# Patient Record
Sex: Male | Born: 1990 | Race: White | Hispanic: No | Marital: Single | Smoking: Never smoker
Health system: Southern US, Community
[De-identification: ages and names within clinical notes are randomized; demographics above are authoritative.]

## PROBLEM LIST (undated history)

## (undated) DIAGNOSIS — T7840XA Allergy, unspecified, initial encounter: Secondary | ICD-10-CM

## (undated) DIAGNOSIS — D6852 Prothrombin gene mutation: Secondary | ICD-10-CM

## (undated) DIAGNOSIS — I82409 Acute embolism and thrombosis of unspecified deep veins of unspecified lower extremity: Principal | ICD-10-CM

## (undated) DIAGNOSIS — L709 Acne, unspecified: Secondary | ICD-10-CM

## (undated) DIAGNOSIS — F419 Anxiety disorder, unspecified: Secondary | ICD-10-CM

## (undated) HISTORY — DX: Allergy, unspecified, initial encounter: T78.40XA

## (undated) HISTORY — DX: Acne, unspecified: L70.9

## (undated) HISTORY — DX: Anxiety disorder, unspecified: F41.9

## (undated) HISTORY — DX: Acute embolism and thrombosis of unspecified deep veins of unspecified lower extremity: I82.409

## (undated) HISTORY — PX: NOSE SURGERY: SHX723

## (undated) HISTORY — PX: OTHER SURGICAL HISTORY: SHX169

## (undated) HISTORY — PX: KNEE SURGERY: SHX244

---
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
## 2009-01-16 ENCOUNTER — Ambulatory Visit: Payer: Self-pay | Admitting: Family Medicine

## 2009-11-26 ENCOUNTER — Telehealth: Payer: Self-pay | Admitting: Family Medicine

## 2010-01-19 ENCOUNTER — Ambulatory Visit: Payer: Self-pay | Admitting: Family Medicine

## 2010-04-16 ENCOUNTER — Ambulatory Visit: Payer: Self-pay | Admitting: Family Medicine

## 2010-06-18 ENCOUNTER — Ambulatory Visit: Payer: Self-pay | Admitting: Family Medicine

## 2010-09-03 ENCOUNTER — Ambulatory Visit: Payer: Self-pay | Admitting: Family Medicine

## 2010-10-12 NOTE — Assessment & Plan Note (Signed)
Summary: F/U ON ANXIETY PROBLEMS // RSN   Vital Signs:  Patient profile:   20 year old male Height:      60.5 inches Weight:      166 pounds BMI:     32.00 Temp:     98.0 degrees F oral BP sitting:   132 / 80  (right arm) CC: FU   CC:  FU.  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 comes in today for follow-up of anxiety.  He mostly has issues with performance anxiety.  To take Ativan and 20 to 40 mg of propranolol before big exams.  He also takes 20 mg of Celexa nightly.  This combination is working well and he has no, complaints.  He's functioned well and doing well in school.  Allergies: No Known Drug Allergies  Past History:  Past medical, surgical, family and social histories (including risk factors) reviewed for relevance to current acute and chronic problems.  Past Medical History: Reviewed history from 11/17/2008 and no changes required. acne Allergic rhinitis Asthma anxiety disorder  Family History: Reviewed history and no changes required.  Social History: Reviewed history from 11/01/2007 and no changes required. Single Never Smoked Alcohol use-no Drug use-no Regular exercise-yes  Review of Systems      See HPI  Physical Exam  General:  Well-developed,well-nourished,in no acute distress; alert,appropriate and cooperative throughout examination Psych:  Cognition and judgment appear intact. Alert and cooperative with normal attention span and concentration. No apparent delusions, illusions, hallucinations   Impression & Recommendations:  Problem # 1:  ANXIETY STATE, UNSPECIFIED (ICD-300.00) Assessment Improved  His updated medication list for this problem includes:    Celexa 20 Mg Tabs (Citalopram hydrobromide) .Marland Kitchen... 1 tab @ bedtime    Ativan 0.5 Mg Tabs (Lorazepam) .Marland Kitchen... Take 1 tablet by mouth two times a day as needed  Complete Medication List: 1)  Flonase 50 Mcg/act Susp (Fluticasone propionate) .... 2 sprays each  nostril daily 2)  Propranolol Hcl 20 Mg Tabs (Propranolol hcl) .... Take one or 2 60 minutes prior to public speaking 3)  Celexa 20 Mg Tabs (Citalopram hydrobromide) .Marland Kitchen.. 1 tab @ bedtime 4)  Ativan 0.5 Mg Tabs (Lorazepam) .... Take 1 tablet by mouth two times a day as needed  Patient Instructions: 1)  continue your current medical program return in one year for follow-up, sooner if any problems Prescriptions: CELEXA 20 MG TABS (CITALOPRAM HYDROBROMIDE) 1 tab @ bedtime  #100 x 3   Entered and Authorized by:   Roderick Pee MD   Signed by:   Roderick Pee MD on 01/19/2010   Method used:   Print then Give to Patient   RxID:   [id number] PROPRANOLOL HCL 20 MG TABS (PROPRANOLOL HCL) take one or 2 60 minutes prior to public speaking  #50 x 4   Entered and Authorized by:   Roderick Pee MD   Signed by:   Roderick Pee MD on 01/19/2010   Method used:   Print then Give to Patient   RxID:   [id number] FLONASE 50 MCG/ACT SUSP (FLUTICASONE PROPIONATE) 2 sprays each nostril daily  #1 x 3   Entered and Authorized by:   Roderick Pee MD   Signed by:   Roderick Pee MD on 01/19/2010   Method used:   Print then Give to Patient   RxID:   [id number]

## 2010-10-12 NOTE — Assessment & Plan Note (Signed)
Summary: hpv inj/njr  Nurse Visit   Allergies: No Known Drug Allergies  Immunizations Administered:  Tetanus Vaccine:    Vaccine Type: Tdap    Site: left deltoid    Mfr: GlaxoSmithKline    Dose: 0.5 ml    Route: IM    Given by: Kern Reap CMA (AAMA)    Exp. Date: 03/11/2012    Lot #: [id number]    VIS given: 07/31/07 version given April 16, 2010.    Physician counseled: yes  HPV # 1:    Vaccine Type: Gardasil    Site: right deltoid    Mfr: Merck    Dose: 0.5 ml    Route: IM    Given by: Kern Reap CMA (AAMA)    Exp. Date: 04/22/2012    Lot #: [id number]    VIS given: 10/14/05 version given April 16, 2010.    Physician counseled: yes  Orders Added: 1)  Tdap => 71yrs IM [90715] 2)  Admin 1st Vaccine [90471] 3)  HPV Vaccine - 3 sched doses - IM [90649] 4)  Admin of Any Addtl Vaccine [14782]

## 2010-10-12 NOTE — Progress Notes (Signed)
Summary: ativan refill  Phone Note Call from Patient   Summary of Call: patient is calling for a refill of ativan. his last office visit was 5/10. is this okay to fill?  pharmacy [phone] and patient's cell number is [phone] Initial call taken by: Kern Reap CMA Duncan Dull),  November 26, 2009 1:49 PM  Follow-up for Phone Call        okay to refill medicine through May.  He needs a visit in May Follow-up by: Roderick Pee MD,  November 26, 2009 1:58 PM    Prescriptions: ATIVAN 0.5 MG TABS (LORAZEPAM) Take 1 tablet by mouth two times a day as needed  #60 x 2   Entered by:   Kern Reap CMA (AAMA)   Authorized by:   Roderick Pee MD   Signed by:   Kern Reap CMA (AAMA) on 11/26/2009   Method used:   Telephoned to ...       [facility]* (retail)       [address]       [place], [place]  [address]       Ph: [phone]       Fax: [phone]   RxID:   [id number]

## 2010-10-12 NOTE — Assessment & Plan Note (Signed)
Summary: 2nd hpv inj/cjr  Nurse Visit   Review of Systems       Flu Vaccine Consent Questions     Do you have a history of severe allergic reactions to this vaccine? no    Any prior history of allergic reactions to egg and/or gelatin? no    Do you have a sensitivity to the preservative Thimersol? no    Do you have a past history of Guillan-Barre Syndrome? no    Do you currently have an acute febrile illness? no    Have you ever had a severe reaction to latex? no    Vaccine information given and explained to patient? yes    Are you currently pregnant? no    Lot Number:[id number]   Exp Date:03/12/2011   Site Given  Right  Deltoid IM    Allergies: No Known Drug Allergies  Immunizations Administered:  HPV # 2:    Vaccine Type: Gardasil    Site: left deltoid    Mfr: Merck    Dose: 0.5 ml    Route: IM    Given by: Kern Reap CMA (AAMA)    Exp. Date: 05/24/2012    Lot #: [id number]    Physician counseled: yes  Orders Added: 1)  Admin 1st Vaccine [90471] 2)  Flu Vaccine 47yrs + [16109] 3)  HPV Vaccine - 3 sched doses - IM [90649] 4)  Admin of Any Addtl Vaccine [60454]

## 2010-10-14 NOTE — Assessment & Plan Note (Signed)
Summary: med check//ccm   Vital Signs:  Patient profile:   20 year old male Weight:      181 pounds Temp:     97.9 degrees F oral BP sitting:   102 / 74  (left arm) Cuff size:   regular  Vitals Entered By: Kern Reap CMA Duncan Dull) (September 03, 2010 11:56 AM) CC: follow-up visit   CC:  follow-up visit.  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  He has a history of underlying anxiety disorder.  It manifests itself by panic-like attacks.  We had him on Celexa 20 mg daily, Ativan, .5, p.r.n., and propranolol 20 mg prior to a big exam and he feels, like he's done very well until recently when his motivation and energy levels have dropped.  Review of systems otherwise negative.  We discussed various options, including increasing his current medication or trying another medication.  Allergies (verified): No Known Drug Allergies  Past History:  Past medical, surgical, family and social histories (including risk factors) reviewed for relevance to current acute and chronic problems.  Past Medical History: Reviewed history from 11/17/2008 and no changes required. acne Allergic rhinitis Asthma anxiety disorder  Family History: Reviewed history and no changes required.  Social History: Reviewed history from 11/01/2007 and no changes required. Single Never Smoked Alcohol use-no Drug use-no Regular exercise-yes  Review of Systems      See HPI  Physical Exam  General:  Well-developed,well-nourished,in no acute distress; alert,appropriate and cooperative throughout examination Psych:  Cognition and judgment appear intact. Alert and cooperative with normal attention span and concentration. No apparent delusions, illusions, hallucinations   Impression & Recommendations:  Problem # 1:  ANXIETY STATE, UNSPECIFIED (ICD-300.00) Assessment Deteriorated  The following medications were removed  from the medication list:    Celexa 20 Mg Tabs (Citalopram hydrobromide) .Marland Kitchen... 1 tab @ bedtime His updated medication list for this problem includes:    Ativan 0.5 Mg Tabs (Lorazepam) .Marland Kitchen... Take 1 tablet by mouth two times a day as needed    Celexa 40 Mg Tabs (Citalopram hydrobromide) .Marland Kitchen... 1 tab @ bedtime  Complete Medication List: 1)  Flonase 50 Mcg/act Susp (Fluticasone propionate) .... 2 sprays each nostril daily 2)  Propranolol Hcl 20 Mg Tabs (Propranolol hcl) .... Take one or 2 60 minutes prior to public speaking 3)  Ativan 0.5 Mg Tabs (Lorazepam) .... Take 1 tablet by mouth two times a day as needed 4)  Cephalexin 500 Mg Caps (Cephalexin) .... Take one tab two times a day 5)  Celexa 40 Mg Tabs (Citalopram hydrobromide) .Marland Kitchen.. 1 tab @ bedtime  Patient Instructions: 1)  increase the Celexa to 40 mg a day, and call me in one month with a progress report Prescriptions: ATIVAN 0.5 MG TABS (LORAZEPAM) Take 1 tablet by mouth two times a day as needed  #60 x 2   Entered and Authorized by:   Roderick Pee MD   Signed by:   Roderick Pee MD on 09/03/2010   Method used:   Print then Give to Patient   RxID:   [id number] CELEXA 40 MG TABS (CITALOPRAM HYDROBROMIDE) 1 tab @ bedtime  #100 x 3   Entered and Authorized by:   Roderick Pee MD   Signed by:   Roderick Pee MD on 09/03/2010   Method used:   Print then Give to Patient   RxID:   [id number]  Orders Added: 1)  Est. Patient Level III [81859]

## 2010-10-18 ENCOUNTER — Ambulatory Visit: Payer: PRIVATE HEALTH INSURANCE | Admitting: Family Medicine

## 2010-10-22 ENCOUNTER — Ambulatory Visit: Payer: Self-pay | Admitting: Family Medicine

## 2011-01-18 ENCOUNTER — Encounter: Payer: Self-pay | Admitting: Family Medicine

## 2011-01-18 ENCOUNTER — Ambulatory Visit (INDEPENDENT_AMBULATORY_CARE_PROVIDER_SITE_OTHER): Payer: PRIVATE HEALTH INSURANCE | Admitting: Family Medicine

## 2011-01-18 DIAGNOSIS — F411 Generalized anxiety disorder: Secondary | ICD-10-CM

## 2011-01-18 DIAGNOSIS — F908 Attention-deficit hyperactivity disorder, other type: Secondary | ICD-10-CM | POA: Insufficient documentation

## 2011-01-18 DIAGNOSIS — F909 Attention-deficit hyperactivity disorder, unspecified type: Secondary | ICD-10-CM

## 2011-01-18 MED ORDER — LISDEXAMFETAMINE DIMESYLATE 20 MG PO CAPS: 20.0000 mg | ORAL_CAPSULE | ORAL | Status: DC

## 2011-01-18 NOTE — Patient Instructions (Signed)
Tried the medication as we discussed.  If you alike to continue that.  Call us and we will explain how we do a 3 month supply

## 2011-01-18 NOTE — Progress Notes (Signed)
  Subjective:    Patient ID: Wesley Stark, male    DOB: Jan 08, 1991, 20 y.o.   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 in the summer,,,,,,, who comes in today to discuss his evaluation that he had done at [facility].  He underwent a comprehensive psychologic evaluation, which demonstrated good.  He did have some ADHD.  They recommended CBT which he will undergo at school.  Also, the question medication.  A couple occasions.  He took a friend's 15-mg Adderall, and it helped his focus and concentration a lot.  We discussed for his options he would like to take a therapeutic trial   Review of Systems    General and psychiatric review of systems otherwise negative Objective:   Physical Exam    Well-developed well-nourished, male in no acute distress    Assessment & Plan:  History of anxiety disorder,,,,,,,,,,,, taper Celexa per patient choice.  ADHD,,,,,,,,,, trial of vyvanse

## 2011-01-28 NOTE — Op Note (Signed)
[facility]  Patient:    Wesley Stark, Wesley Stark                 MRN: [id number] Proc. Date: 02/14/01 Adm. Date:  [id number] Disc. Date: [id number] Attending:  Sandi Raveling                           Operative Report  DATE OF BIRTH:  Feb 09, 1991  Wesley Stark is a very pleasant 20 year old male, who presents status post slam in his right middle finger into a door.  He is noted to have a distal phalanx displaced fracture through the growth plate, open nail bed laceration, and nail plate evulsion proximally.  The patient notes significant pain in this region.  He denies other injury.  The patient has had a tetanus shot.  The patient denies other complaints.  I have reviewed his past medical history, etc., at length.  The patient has a normal review of systems and no recent illnesses.  PAST MEDICAL HISTORY:  None.  PAST SURGICAL HISTORY:  None.  ALLERGIES:  None.  MEDICINES:  None.  SOCIAL HISTORY:  He is a healthy 20 year old, lives with his parents.  PHYSICAL EXAMINATION:  White male, alert and oriented, in no acute distress. Middle finger is examined.  He has normal refill at the tip.  Flexor appears to be intact.  Extension is difficult to examine secondary to the open displaced P3 fracture at the growth plate.  The nail is lifted off, and there is a nail be laceration.  This is through his germinal matrix region.  The patient does have any PIP or MCP joint irregularities which are obvious.  I reviewed the remained of the upper extremity exam which is benign.  The left upper extremity is benign.  HEENT:  Within normal limits.  CHEST/ABDOMEN: Benign.  X-rays show a displaced distal phalanx fracture through the growth plate.  IMPRESSION:  Open P3 fracture through the growth plate about the right middle finger with associated nail bed laceration.  PLAN:  I have verbally consented him for I&D and repair of structure  as necessary.  OPERATIVE PROCEDURE:  The patient was seen by myself.  He was given a lidocaine and Marcaine without epinephrine block with ______  added. Following intermetacarpal block, I performed a scrub and paint with Betadine followed by securing a sterile field.  Once the sterile field was secured, the patient had tourniquet applied, and the finger underwent nail plate removal without difficulty.  I examined the germinal matrix which was lacerated and then performed an I&D.  The fracture was grossly displaced and poorly reduced through the growth plate with apex dorsal angulation.  The patient had an excisional debridement of particulate matter, skin and subcutaneous tissue, nail bed, and bone performed at this time with copious lavage.  Following this, the patient underwent reduction of the fracture without difficulty. Once the fracture was reduced, he underwent a nail bed repair at the germinal matrix followed by closure of the eponychial folds.  The eponychial folds were opened with a knife blade for exposure purposes to allow for nail bed repair, etc.  These were closed with 4-0 chromic suture.  The nail bed was repaired with the 6-0 chromic suture.  The reduction was held nicely, tourniquet was deflated, refill was noted to be excellent, and the patient had Adaptic placed under the eponychial fold to prevent nail bed adherence followed by maintaining the reduced position  and application of a finger splint. Reduction was adequate in its position at this time.  The patient tolerated this well.  Postoperative films were taken as well to verify reduction, etc. Following this, the patient was discharged home.  He was discharged home on Keflex appropriate to his age and weight as well as Lortab elixir.  He will follow up in the office weekly to assess fracture reduction, etc.  He was given full precautions in terms of elevation, no use of the finger and hand whatsoever, and no  swimming.  Keep it clean and dry and notify should any problems occur.  All questions have been encouraged and answered. DD:  02/14/01 TD:  02/15/01 Job: [id number]

## 2011-02-03 ENCOUNTER — Telehealth: Payer: Self-pay | Admitting: *Deleted

## 2011-02-03 DIAGNOSIS — F908 Attention-deficit hyperactivity disorder, other type: Secondary | ICD-10-CM

## 2011-02-03 MED ORDER — LISDEXAMFETAMINE DIMESYLATE 20 MG PO CAPS: 20.0000 mg | ORAL_CAPSULE | ORAL | Status: DC

## 2011-02-03 NOTE — Telephone Encounter (Signed)
Pt was dx with a few weeks ago and prescribed vyvance was told to call Fleet Contras with an update in two weeks.  Please return call to pt.

## 2011-02-04 NOTE — Telephone Encounter (Signed)
rx ready for pick up. Left message on machine for patient. 

## 2011-02-08 ENCOUNTER — Telehealth: Payer: Self-pay | Admitting: *Deleted

## 2011-02-08 NOTE — Telephone Encounter (Signed)
patient  Is calling because he has stopped his pravastatin due to muscle pain.  He would like to know if it is okay to not restart the medication for a couple of weeks because he is going to have surgery on his knee?

## 2011-02-09 ENCOUNTER — Telehealth: Payer: Self-pay | Admitting: *Deleted

## 2011-02-09 DIAGNOSIS — F908 Attention-deficit hyperactivity disorder, other type: Secondary | ICD-10-CM

## 2011-02-09 NOTE — Telephone Encounter (Signed)
Hold the medication for two months, then restart by taking one tablet Monday, and Thursday, with a follow-up lipid panel two months after restarting medication

## 2011-02-09 NOTE — Telephone Encounter (Signed)
Patient would like to increase his vyvanse to 40 mg.  He would like to know if it would be better to take 20  mg bid or 40 mg qd.  He has returned the original rx for vyvanse 20.

## 2011-02-09 NOTE — Telephone Encounter (Signed)
Left message on machine for patient

## 2011-02-10 MED ORDER — LISDEXAMFETAMINE DIMESYLATE 20 MG PO CAPS: 20.0000 mg | ORAL_CAPSULE | Freq: Two times a day (BID) | ORAL | Status: DC

## 2011-02-10 NOTE — Telephone Encounter (Signed)
Pt called back and said that he would like to take Vyvanse 20 mg bid. Pt is req to pick up script today. Pls call pt when ready for pick up today.

## 2011-02-10 NOTE — Telephone Encounter (Signed)
40 daily, dispensed 30 tabs, refills x 2

## 2011-02-10 NOTE — Telephone Encounter (Signed)
Left message on machine for patient to return our call 

## 2011-02-11 NOTE — Telephone Encounter (Signed)
rx is ready for pick up and patient is aware 

## 2011-04-07 ENCOUNTER — Telehealth: Payer: Self-pay | Admitting: Family Medicine

## 2011-04-07 MED ORDER — CITALOPRAM HYDROBROMIDE 20 MG PO TABS: 20.0000 mg | ORAL_TABLET | Freq: Every day | ORAL | Status: DC

## 2011-04-07 NOTE — Telephone Encounter (Signed)
Celexa 20 mg, dispense 100 tabs directions one nightly, refills x 2

## 2011-04-07 NOTE — Telephone Encounter (Signed)
Pt would like to taper off Celexa. [facility] the [facility]

## 2011-04-07 NOTE — Telephone Encounter (Signed)
rx sent in electronically, pt aware 

## 2011-04-28 ENCOUNTER — Encounter: Payer: Self-pay | Admitting: Family Medicine

## 2011-04-28 ENCOUNTER — Ambulatory Visit (INDEPENDENT_AMBULATORY_CARE_PROVIDER_SITE_OTHER): Payer: PRIVATE HEALTH INSURANCE | Admitting: Family Medicine

## 2011-04-28 DIAGNOSIS — F909 Attention-deficit hyperactivity disorder, unspecified type: Secondary | ICD-10-CM

## 2011-04-28 DIAGNOSIS — F411 Generalized anxiety disorder: Secondary | ICD-10-CM

## 2011-04-28 DIAGNOSIS — F908 Attention-deficit hyperactivity disorder, other type: Secondary | ICD-10-CM

## 2011-04-28 MED ORDER — LISDEXAMFETAMINE DIMESYLATE 20 MG PO CAPS: 20.0000 mg | ORAL_CAPSULE | ORAL | Status: DC

## 2011-04-28 MED ORDER — CITALOPRAM HYDROBROMIDE 20 MG PO TABS: 20.0000 mg | ORAL_TABLET | Freq: Every day | ORAL | Status: DC

## 2011-04-28 MED ORDER — VYVANSE 20 MG PO CAPS: 20.0000 mg | ORAL_CAPSULE | ORAL | Status: DC

## 2011-04-28 NOTE — Progress Notes (Signed)
  Subjective:    Patient ID: Wesley Stark, male    DOB: 1991/03/04, 20 y.o.   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 Comes in today for follow-up of mild anxiety and ADHD.  Is currently taking Celexa 10 mg nightly and we started him on a trial of vy Vance 20 mg b.i.d.  Is doing very well.  His focus and concentration, and anxiety of all diminished since he can focus on reading get his studies done.  He switched from premed to pre-pharmacy   Review of Systems General and psychiatric review of systems otherwise negative    Objective:   Physical Exam Well developed, well nourished, male in no acute distress       Assessment & Plan:  Mild anxiety.  Continue Celexa 10 nightly  ADHD.  Continue vyvanse 20, b.i.d.

## 2011-04-28 NOTE — Patient Instructions (Signed)
Continue the Celexa 10 mg however if you feel increased anxiety or stress increase the dose to 20 mg a day.  Continue vyvanse   Twice daily.................. when you r  two weeks from being out of your third prescription call and leave a voicemail for [facility] my nurse, so we can rewrite her medication.  Return in August 2013 sooner if any problems

## 2011-05-31 ENCOUNTER — Telehealth: Payer: Self-pay | Admitting: Family Medicine

## 2011-05-31 NOTE — Telephone Encounter (Signed)
Pt said that last VYVANSE 20 MG capsule was written incorrectly. Pt was told by pcp to take 1 pill in a.m and 1 pill in pm #60.   Script was sent in as 1 pill a day #60.   Pls correct instructions to say 1 bid. Pt will turn in old scripts and pick up new ones when avail.

## 2011-06-06 NOTE — Telephone Encounter (Signed)
Left message on machine for patient  To bring prescription with him.

## 2011-06-09 MED ORDER — LISDEXAMFETAMINE DIMESYLATE 20 MG PO CAPS: 20.0000 mg | ORAL_CAPSULE | Freq: Two times a day (BID) | ORAL | Status: DC

## 2011-07-14 ENCOUNTER — Ambulatory Visit (INDEPENDENT_AMBULATORY_CARE_PROVIDER_SITE_OTHER): Payer: PRIVATE HEALTH INSURANCE | Admitting: Family Medicine

## 2011-07-14 ENCOUNTER — Encounter: Payer: Self-pay | Admitting: Family Medicine

## 2011-07-14 VITALS — BP 102/78 | Temp 97.8°F | Wt 177.0 lb

## 2011-07-14 DIAGNOSIS — F411 Generalized anxiety disorder: Secondary | ICD-10-CM

## 2011-07-14 DIAGNOSIS — F909 Attention-deficit hyperactivity disorder, unspecified type: Secondary | ICD-10-CM

## 2011-07-14 DIAGNOSIS — F908 Attention-deficit hyperactivity disorder, other type: Secondary | ICD-10-CM

## 2011-07-14 DIAGNOSIS — J309 Allergic rhinitis, unspecified: Secondary | ICD-10-CM

## 2011-07-14 DIAGNOSIS — Z23 Encounter for immunization: Secondary | ICD-10-CM

## 2011-07-14 MED ORDER — LISDEXAMFETAMINE DIMESYLATE 20 MG PO CAPS: ORAL_CAPSULE | ORAL | Status: DC

## 2011-07-14 MED ORDER — ATENOLOL 25 MG PO TABS: ORAL_TABLET | ORAL | Status: DC

## 2011-07-14 MED ORDER — LISDEXAMFETAMINE DIMESYLATE 20 MG PO CAPS: 20.0000 mg | ORAL_CAPSULE | Freq: Two times a day (BID) | ORAL | Status: DC

## 2011-07-14 MED ORDER — FLUTICASONE PROPIONATE 50 MCG/ACT NA SUSP: 2.0000 | Freq: Every day | NASAL | Status: DC

## 2011-07-14 NOTE — Progress Notes (Signed)
  Subjective:    Patient ID: Wesley Stark, male    DOB: 12/22/1990, 20 y.o.   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  He takes Vyvanse 20 mg b.i.d. For ADD and doing well.  He was like a refill on a steroid nasal spray.  He takes Celexa 20 mg nightly for mild anxiety and depression.  He also symptoms of fights and slight with giving speeches.  We discussed adding ACE low-dose beta-blocker.   Review of Systems General psychiatric, cardiovascular review of systems otherwise negative    Objective:   Physical Exam  Well-developed well-nourished man in no acute distress.  Examination heart and lungs is normal      Assessment & Plan:  Adult ADD continue medication.  Mild anxiety/depression continue Celexa 20 mg nightly  Allergic rhinitis OTC Claritin, plus steroid nasal spray.  Tenormin 25 mg p.r.n.

## 2011-07-14 NOTE — Patient Instructions (Signed)
Continue your current medications.  The Tenormin is one half to one tablet one hour prior to public speaking.  Return in one year or sooner if any problem

## 2011-10-11 ENCOUNTER — Ambulatory Visit (INDEPENDENT_AMBULATORY_CARE_PROVIDER_SITE_OTHER): Payer: PRIVATE HEALTH INSURANCE | Admitting: Family Medicine

## 2011-10-11 ENCOUNTER — Encounter: Payer: Self-pay | Admitting: Family Medicine

## 2011-10-11 VITALS — BP 116/76 | HR 68 | Temp 98.4°F | Wt 167.0 lb

## 2011-10-11 DIAGNOSIS — F430 Acute stress reaction: Secondary | ICD-10-CM

## 2011-10-11 DIAGNOSIS — F909 Attention-deficit hyperactivity disorder, unspecified type: Secondary | ICD-10-CM

## 2011-10-11 DIAGNOSIS — R4589 Other symptoms and signs involving emotional state: Secondary | ICD-10-CM

## 2011-10-11 DIAGNOSIS — F908 Attention-deficit hyperactivity disorder, other type: Secondary | ICD-10-CM

## 2011-10-11 DIAGNOSIS — R05 Cough: Secondary | ICD-10-CM

## 2011-10-11 MED ORDER — LISDEXAMFETAMINE DIMESYLATE 20 MG PO CAPS: ORAL_CAPSULE | ORAL | Status: DC

## 2011-10-11 MED ORDER — LISDEXAMFETAMINE DIMESYLATE 20 MG PO CAPS: 20.0000 mg | ORAL_CAPSULE | Freq: Two times a day (BID) | ORAL | Status: DC

## 2011-10-11 MED ORDER — HYDROCODONE-HOMATROPINE 5-1.5 MG/5ML PO SYRP: ORAL_SOLUTION | ORAL | Status: DC

## 2011-10-11 MED ORDER — PROPRANOLOL HCL 20 MG PO TABS: 20.0000 mg | ORAL_TABLET | Freq: Two times a day (BID) | ORAL | Status: DC

## 2011-10-11 MED ORDER — LORAZEPAM 0.5 MG PO TABS: 0.5000 mg | ORAL_TABLET | Freq: Two times a day (BID) | ORAL | Status: DC

## 2011-10-11 NOTE — Patient Instructions (Signed)
Drink lots of water  Hydromet 1/2-1 teaspoon at bedtime when necessary for cough  Vaporizer in your bedroom at night  Continue the long-acting medication twice daily but moved the morning dose to 30 minutes prior to her first class  Return when necessary  Call 2 weeks prior to being out of your third prescription for refills

## 2011-10-11 NOTE — Progress Notes (Signed)
  Subjective:    Patient ID: Wesley Stark, male    DOB: 1990-11-02, 21 y.o.   MRN: [id number]  HPI Wesley Stark is a 21 year old single male nonsmoker comes in today for evaluation of 2 problems  He has a history of adult ADD and he takes a long-acting medication by Faylene Million twice a day 20 mg and he needs a refill on his medication. He states he's doing well and no complications from his medication  He had nasal surgery about 2 weeks ago. Septoplasty and turbinate reduction. 5 days ago he developed a cold. He has had congestion runny nose postnasal drip slight cough no wheezing. He has had a history of asthma in the past.    Review of Systems    Gen. ENT and psychiatric review of systems otherwise negative Objective:   Physical Exam  Well-developed well-nourished male in no acute distress examination of the HEENT were negative neck was supple no adenopathy lungs are clear      Assessment & Plan:  Adult ADD continue current medication 20 mg twice a day  Viral syndrome drink lots of fluids Hydromet 1/2-1 teaspoon each bedtime when necessary and return when necessary

## 2011-10-17 ENCOUNTER — Ambulatory Visit: Payer: PRIVATE HEALTH INSURANCE | Admitting: Family Medicine

## 2011-12-14 ENCOUNTER — Telehealth: Payer: Self-pay | Admitting: Family Medicine

## 2011-12-14 DIAGNOSIS — F411 Generalized anxiety disorder: Secondary | ICD-10-CM

## 2011-12-14 NOTE — Telephone Encounter (Signed)
Patient called stating that he need a refill on his lorazepam 0.5 mg, propanolol 20 mg sent to [facility] on [address]. Please assist.

## 2011-12-15 MED ORDER — PROPRANOLOL HCL 20 MG PO TABS: 20.0000 mg | ORAL_TABLET | Freq: Two times a day (BID) | ORAL | Status: DC

## 2011-12-15 MED ORDER — LORAZEPAM 0.5 MG PO TABS: 0.5000 mg | ORAL_TABLET | Freq: Two times a day (BID) | ORAL | Status: DC

## 2012-02-02 ENCOUNTER — Telehealth: Payer: Self-pay | Admitting: Family Medicine

## 2012-02-02 DIAGNOSIS — F411 Generalized anxiety disorder: Secondary | ICD-10-CM

## 2012-02-02 DIAGNOSIS — F908 Attention-deficit hyperactivity disorder, other type: Secondary | ICD-10-CM

## 2012-02-02 MED ORDER — LISDEXAMFETAMINE DIMESYLATE 20 MG PO CAPS: ORAL_CAPSULE | ORAL | Status: DC

## 2012-02-02 MED ORDER — PROPRANOLOL HCL 20 MG PO TABS: 20.0000 mg | ORAL_TABLET | Freq: Two times a day (BID) | ORAL | Status: DC

## 2012-02-02 MED ORDER — LISDEXAMFETAMINE DIMESYLATE 20 MG PO CAPS: 20.0000 mg | ORAL_CAPSULE | Freq: Two times a day (BID) | ORAL | Status: DC

## 2012-02-02 NOTE — Telephone Encounter (Signed)
Pt called req refill of lisdexamfetamine (VYVANSE) 20 MG capsule and propranolol (INDERAL) 20 MG tablet.

## 2012-02-03 NOTE — Telephone Encounter (Signed)
Rx ready for pick up and Left message on machine for patient   

## 2012-02-12 ENCOUNTER — Other Ambulatory Visit: Payer: Self-pay | Admitting: Family Medicine

## 2012-05-07 ENCOUNTER — Telehealth: Payer: Self-pay | Admitting: Family Medicine

## 2012-05-07 DIAGNOSIS — F908 Attention-deficit hyperactivity disorder, other type: Secondary | ICD-10-CM

## 2012-05-07 NOTE — Telephone Encounter (Signed)
Pt called req 3 month rx for lisdexamfetamine (VYVANSE) 20 MG capsule

## 2012-05-08 ENCOUNTER — Telehealth: Payer: Self-pay | Admitting: Family Medicine

## 2012-05-08 MED ORDER — LISDEXAMFETAMINE DIMESYLATE 20 MG PO CAPS: ORAL_CAPSULE | ORAL | Status: DC

## 2012-05-08 MED ORDER — PROPRANOLOL HCL 20 MG PO TABS: 20.0000 mg | ORAL_TABLET | Freq: Every day | ORAL | Status: DC | PRN

## 2012-05-08 MED ORDER — LISDEXAMFETAMINE DIMESYLATE 20 MG PO CAPS: 20.0000 mg | ORAL_CAPSULE | Freq: Two times a day (BID) | ORAL | Status: DC

## 2012-05-08 NOTE — Telephone Encounter (Signed)
Patient is aware and refill of Inderal was sent

## 2012-05-08 NOTE — Telephone Encounter (Signed)
Pt states that he is on 0.5mg  Ativan. He took 1mg  yesterday to do a presentation, and it did not work at all. Pt wondering if he should have taken the Indreal as well? His heart was beating so fast, he could not breathe, per pt. Pt has to do a presentation every Monday, and would like to do something different before next Monday. Please call. Will be in class 12:30 - 6:30pm.

## 2012-05-08 NOTE — Telephone Encounter (Signed)
Rx ready for pick up and patient is aware 

## 2012-05-08 NOTE — Telephone Encounter (Signed)
Fleet Contras please call I would take the Ativan 0.5 and the Inderal both one hour prior to presentation

## 2012-07-19 ENCOUNTER — Other Ambulatory Visit: Payer: Self-pay | Admitting: *Deleted

## 2012-07-26 ENCOUNTER — Ambulatory Visit: Payer: PRIVATE HEALTH INSURANCE | Admitting: *Deleted

## 2012-08-07 ENCOUNTER — Telehealth: Payer: Self-pay | Admitting: Oncology

## 2012-08-07 NOTE — Telephone Encounter (Signed)
C/D 08/07/12 for appt.08/29/12 °

## 2012-08-08 ENCOUNTER — Telehealth: Payer: Self-pay | Admitting: Oncology

## 2012-08-08 ENCOUNTER — Other Ambulatory Visit: Payer: Self-pay | Admitting: Oncology

## 2012-08-08 ENCOUNTER — Encounter: Payer: Self-pay | Admitting: Oncology

## 2012-08-08 DIAGNOSIS — I82409 Acute embolism and thrombosis of unspecified deep veins of unspecified lower extremity: Secondary | ICD-10-CM

## 2012-08-08 HISTORY — DX: Acute embolism and thrombosis of unspecified deep veins of unspecified lower extremity: I82.409

## 2012-08-08 NOTE — Telephone Encounter (Signed)
Faxed ROI to Dr. [place] Hilty to obtain records [id number].

## 2012-08-22 ENCOUNTER — Other Ambulatory Visit (HOSPITAL_BASED_OUTPATIENT_CLINIC_OR_DEPARTMENT_OTHER): Payer: PRIVATE HEALTH INSURANCE | Admitting: Lab

## 2012-08-22 DIAGNOSIS — I82409 Acute embolism and thrombosis of unspecified deep veins of unspecified lower extremity: Secondary | ICD-10-CM

## 2012-08-22 LAB — CBC & DIFF AND RETIC
BASO%: 0.2 % (ref 0.0–2.0)
EOS%: 2.4 % (ref 0.0–7.0)
HCT: 47.5 % (ref 38.4–49.9)
Immature Retic Fract: 3.6 % (ref 3.00–10.60)
MCH: 30.8 pg (ref 27.2–33.4)
MCHC: 34.5 g/dL (ref 32.0–36.0)
NEUT%: 51.6 % (ref 39.0–75.0)
RBC: 5.33 10*6/uL (ref 4.20–5.82)
Retic %: 1.44 % (ref 0.80–1.80)
lymph#: 2.5 10*3/uL (ref 0.9–3.3)

## 2012-08-22 LAB — COMPREHENSIVE METABOLIC PANEL (CC13)
AST: 19 U/L (ref 5–34)
Albumin: 4.1 g/dL (ref 3.5–5.0)
BUN: 15 mg/dL (ref 7.0–26.0)
CO2: 31 mEq/L — ABNORMAL HIGH (ref 22–29)
Calcium: 9.7 mg/dL (ref 8.4–10.4)
Chloride: 103 mEq/L (ref 98–107)
Creatinine: 0.9 mg/dL (ref 0.7–1.3)
Glucose: 93 mg/dl (ref 70–99)
Potassium: 4.2 mEq/L (ref 3.5–5.1)

## 2012-08-22 LAB — LACTATE DEHYDROGENASE (CC13): LDH: 138 U/L (ref 125–245)

## 2012-08-23 LAB — CARDIOLIPIN ANTIBODIES, IGG, IGM, IGA
Anticardiolipin IgA: 9 APL U/mL (ref <22)
Anticardiolipin IgG: 12 GPL U/mL (ref <23)
Anticardiolipin IgM: 4 MPL U/mL (ref <11)

## 2012-08-23 LAB — LUPUS ANTICOAGULANT PANEL
PTT Lupus Anticoagulant: 43.7 secs — ABNORMAL HIGH (ref 28.0–43.0)
PTTLA 4:1 Mix: 40.6 secs (ref 28.0–43.0)

## 2012-08-23 LAB — BETA-2 GLYCOPROTEIN ANTIBODIES
Beta-2 Glyco I IgG: 1 G Units (ref <20)
Beta-2-Glycoprotein I IgM: 2 M Units (ref <20)

## 2012-08-29 ENCOUNTER — Other Ambulatory Visit: Payer: PRIVATE HEALTH INSURANCE | Admitting: Lab

## 2012-08-29 ENCOUNTER — Ambulatory Visit (HOSPITAL_BASED_OUTPATIENT_CLINIC_OR_DEPARTMENT_OTHER): Payer: PRIVATE HEALTH INSURANCE | Admitting: Oncology

## 2012-08-29 ENCOUNTER — Ambulatory Visit: Payer: PRIVATE HEALTH INSURANCE

## 2012-08-29 VITALS — BP 89/56 | HR 63 | Temp 97.1°F | Resp 20 | Ht 70.0 in | Wt 169.9 lb

## 2012-08-29 DIAGNOSIS — Z7901 Long term (current) use of anticoagulants: Secondary | ICD-10-CM

## 2012-08-29 DIAGNOSIS — I82409 Acute embolism and thrombosis of unspecified deep veins of unspecified lower extremity: Secondary | ICD-10-CM

## 2012-08-29 NOTE — Patient Instructions (Signed)
Continue Xarelto for another 3 months Repeat lab 3/11 See DrG 3/18

## 2012-08-29 NOTE — Progress Notes (Signed)
New Patient Hematology-Oncology Evaluation   Wesley Stark [id number] [birth year] 21 y.o. 08/29/2012  CC: Dr. Kelle Darting; Dr. Italy Hilty   Reason for referral: Advice on long-term anticoagulation in a young man who recently had a left lower extremity DVT and is found to be a heterozygote for the prothrombin gene mutation   HPI:  21 year old man who just graduated college this month and is applying to pharmacy school. His father is a Teacher, early years/pre. His mother is a Engineer, civil (consulting) in our practice. He has been in overall excellent health except for ADHD. He was working out with [employer] at the gym back in September of this year. He began to experience pain in his left calf which he originally thought was originating from his knee. He has had 2 prior arthroscopic surgeries on the left knee for sports injuries in 2006 and again in 2009. He saw his surgeon who did not feel he had a knee problem. He referred him for a Doppler study on   September 25.  Study of the left lower extremity was positive for deep venous thrombosis. He was started on Xarelto 15 mg twice daily for 3 weeks and then maintenance dose 20 mg daily. He reports at one point that he was gasping for air. He did not have these complaints at time he went for the Doppler study. No evaluation was done to exclude pulmonary embolus.  Laboratory studies initially showed a positive lupus-type anticoagulant. This was repeated in anticipation of today's visit with me along with an anticardiolipin antibody and beta 2 glycoprotein 1 panel and all tests were negative. He did test positive for heterozygous status of the prothrombin gene mutation. He was negative for factor V Leiden mutation. Baseline pro time 13.6 seconds, antithrombin 117% (76-126), plasma homocystine 11.9 (4-15.4), free protein S 82% (57-171) protein C functional 162% (75-133). D-dimer was significantly elevated at 15.48 normal less than 0.48.  He is tolerating the Xarelto well with no  obvious side effects.  There is no family history of clotting in his parents or his fraternal twin sister. They have not been checked yet for the gene mutation.  He has no signs or symptoms of a collagen vascular disorder.   PMH: Past Medical History  Diagnosis Date  . Acne   . Allergy   . Asthma   . Anxiety   . DVT, lower extremity 08/08/2012    06/06/12    Past Surgical History  Procedure Date  . Knee surgery     x 2 - left  . Finger sugery     right middle finger    Allergies: No Known Allergies  Medications: Propecia 1 mg daily for hair loss, Flonase nasal spray when necessary,Vyvance  amphetamine derivative 20 mg twice a day, Inderal 20 mg twice a day or when necessary anxiety, Xarelto 20 mg daily.   Social History:  Single period of significant to pharmacy school. He has a twin sister. Nonsmoker. He drinks a few beers a week..  Family History: See history of present illness  Review of Systems: Constitutional symptoms: No constitutional symptoms HEENT: No sore throat Respiratory: Currently no trouble breathing. See history of present illness Cardiovascular:  No chest pain. He does get occasional palpitations. Gastrointestinal ROS: No GI symptoms Genito-Urinary ROS: No GU symptoms Hematological and Lymphatic: Musculoskeletal: No muscle or bone pain Neurologic: No headache or change in vision Dermatologic: No rash or ecchymosis Remaining ROS negative.  Physical Exam: Blood pressure 89/56, pulse 63, temperature 97.1 F (36.2 C),  temperature source Oral, resp. rate 20, height 5\' 10"  (1.778 m), weight 169 lb 14.4 oz (77.066 kg). Wt Readings from Last 3 Encounters:  08/29/12 169 lb 14.4 oz (77.066 kg)  10/11/11 167 lb (75.751 kg)  07/14/11 177 lb (80.287 kg)    General appearance: Well-nourished Caucasian young man Head: Normal Neck: Normal Lymph nodes: No adenopathy Breasts: Lungs: Clear to auscultation, resonant to percussion Heart: Regular rhythm,  no murmur gallop or click Abdominal: Soft, nontender, no mass, no organomegaly GU: Extremities: No edema, no calf tenderness. Left calf 36 cm right 35.5, left ankle 22 cm right 21. Neurologic: PERRLA, optic discs sharp vessels normal, reflexes 1+ symmetric, motor strength 5 over 5 Skin: No rash or ecchymosis    Lab Results: Lab Results  Component Value Date   WBC 6.1 08/22/2012   HGB 16.4 08/22/2012   HCT 47.5 08/22/2012   MCV 89.1 08/22/2012   PLT 176 08/22/2012     Chemistry      Component Value Date/Time   NA 141 08/22/2012 1449   NA 140 12/18/2006 1055   K 4.2 08/22/2012 1449   K 4.4 12/18/2006 1055   CL 103 08/22/2012 1449   CL 101 12/18/2006 1055   CO2 31* 08/22/2012 1449   CO2 33* 12/18/2006 1055   BUN 15.0 08/22/2012 1449   BUN 11 12/18/2006 1055   CREATININE 0.9 08/22/2012 1449   CREATININE 0.8 12/18/2006 1055      Component Value Date/Time   CALCIUM 9.7 08/22/2012 1449   CALCIUM 9.9 12/18/2006 1055   ALKPHOS 62 08/22/2012 1449   ALKPHOS 75 12/18/2006 1055   AST 19 08/22/2012 1449   AST 23 12/18/2006 1055   ALT 25 08/22/2012 1449   ALT 18 12/18/2006 1055   BILITOT 0.41 08/22/2012 1449   BILITOT 0.8 12/18/2006 1055       Review of peripheral blood film: Normochromic normocytic red cells. Mature white cells. Normal platelets. Occasional reactive lymphocytes of the benign, large granular type.   Radiological Studies: There is a report in the system of a transesophageal echocardiogram done for palpitations when he was 21 years old on 01/10/2007. He had a trileaflet aortic valve. Normal ejection fraction of 60% with normal wall motion. I do not see that a bubble study was done     Impression and Plan: Left lower extremity DVT occurring during strenuous exercise with subsequent finding of heterozygote status for the prothrombin gene mutation. There is no family history of clotting. A lupus-type anticoagulant test initially positive but not reproducible and there are no  antibodies against Cardiolipin and or beta 2 glycoprotein 1.  Recommendation: I would treat this as a low risk event and anticoagulate for 6 months. Minimum duration of anticoagulation 3 months but I am more conservative given the genetic predisposing factor and the clinical suspicion that he likely also had a pulmonary embolus at time of the DVT. He had a significant elevation of the D.-dimer which I wouldn't expect just from a isolated DVT of an extremity. I gave his parents prescriptions to have testing done on themselves. His sister should also be tested.  I'll see him again in 3 months when he is due to come off anticoagulation and repeat a d-dimer at that time. I do not feel that he needs to be on long-term anticoagulation for a single event. However if he has a second event in the future then we would need to reconsider for long-term anticoagulation.       GRANFORTUNA,JAMES  M, MD 08/29/2012, 5:43 PM

## 2012-08-30 ENCOUNTER — Telehealth: Payer: Self-pay | Admitting: Oncology

## 2012-08-30 NOTE — Telephone Encounter (Signed)
Called patient and left message regarding appt on March 2014 lab and MD

## 2012-10-27 ENCOUNTER — Other Ambulatory Visit: Payer: Self-pay

## 2012-11-01 ENCOUNTER — Telehealth: Payer: Self-pay | Admitting: Oncology

## 2012-11-20 ENCOUNTER — Other Ambulatory Visit (HOSPITAL_BASED_OUTPATIENT_CLINIC_OR_DEPARTMENT_OTHER): Payer: PRIVATE HEALTH INSURANCE

## 2012-11-20 DIAGNOSIS — I82409 Acute embolism and thrombosis of unspecified deep veins of unspecified lower extremity: Secondary | ICD-10-CM

## 2012-11-20 LAB — CBC WITH DIFFERENTIAL/PLATELET
Basophils Absolute: 0 10*3/uL (ref 0.0–0.1)
Eosinophils Absolute: 0.1 10*3/uL (ref 0.0–0.5)
HGB: 16.1 g/dL (ref 13.0–17.1)
LYMPH%: 37.5 % (ref 14.0–49.0)
MCV: 90.2 fL (ref 79.3–98.0)
MONO#: 0.4 10*3/uL (ref 0.1–0.9)
MONO%: 6.6 % (ref 0.0–14.0)
NEUT#: 3.2 10*3/uL (ref 1.5–6.5)
Platelets: 164 10*3/uL (ref 140–400)
RBC: 5.19 10*6/uL (ref 4.20–5.82)
RDW: 12.9 % (ref 11.0–14.6)
WBC: 5.9 10*3/uL (ref 4.0–10.3)

## 2012-11-21 LAB — D-DIMER, QUANTITATIVE: D-Dimer, Quant: 0.27 ug/mL-FEU (ref 0.00–0.48)

## 2012-11-23 ENCOUNTER — Ambulatory Visit: Payer: PRIVATE HEALTH INSURANCE | Admitting: Oncology

## 2012-11-30 ENCOUNTER — Ambulatory Visit (HOSPITAL_BASED_OUTPATIENT_CLINIC_OR_DEPARTMENT_OTHER): Payer: PRIVATE HEALTH INSURANCE | Admitting: Oncology

## 2012-11-30 VITALS — BP 122/84 | HR 66 | Temp 97.5°F | Resp 20 | Ht 70.0 in | Wt 181.1 lb

## 2012-11-30 DIAGNOSIS — I82402 Acute embolism and thrombosis of unspecified deep veins of left lower extremity: Secondary | ICD-10-CM

## 2012-11-30 DIAGNOSIS — D6852 Prothrombin gene mutation: Secondary | ICD-10-CM

## 2012-11-30 DIAGNOSIS — I82409 Acute embolism and thrombosis of unspecified deep veins of unspecified lower extremity: Secondary | ICD-10-CM

## 2012-11-30 DIAGNOSIS — D6859 Other primary thrombophilia: Secondary | ICD-10-CM

## 2012-12-01 DIAGNOSIS — D6852 Prothrombin gene mutation: Secondary | ICD-10-CM | POA: Insufficient documentation

## 2012-12-01 NOTE — Progress Notes (Signed)
Hematology and Oncology Follow Up Visit  Wesley Stark [id number] [birth year] 22 y.o. 12/01/2012 12:37 PM   Principle Diagnosis:No diagnosis found.   Interim History:  Short interval followup visit for this pleasant 22 year old man who developed a deep venous thrombosis of his left calf veins documented on June 06, 2012 which occurred coincidentally with strenuous exercise with weights. He had 2 previous arthroscopic procedures on his left knee in 2006 in 2009 without any problems. There was no family history of clotting. He has a fraternal twin sister. Hypercoagulation evaluation revealed he is a heterozygote for the prothrombin gene mutation. Since his first visit with me in December, we have tested his other family members. His father and his sister test negative but his mother is positive. (She is a Engineer, civil (consulting) who works in our office). He has had complete resolution of previous pain and swelling in his calf. He has no new symptoms and denies any dyspnea chest pain or palpitations.   Medications: reviewed  Allergies: No Known Allergies  Review of Systems: Constitutional:  No constitutional symptoms  Respiratory:See above  Cardiovascular:  See above  Gastrointestinal:Not questioned  Genito-Urinary: Not questioned  Musculoskeletal:See above  Neurologic:Not questioned  Skin:No rash or ecchymosis  Remaining ROS negative.  Physical Exam: Blood pressure 122/84, pulse 66, temperature 97.5 F (36.4 C), temperature source Oral, resp. rate 20, height 5\' 10"  (1.778 m), weight 181 lb 1.6 oz (82.146 kg). Wt Readings from Last 3 Encounters:  11/30/12 181 lb 1.6 oz (82.146 kg)  08/29/12 169 lb 14.4 oz (77.066 kg)  10/11/11 167 lb (75.751 kg)     General appearance: Healthy-appearing Caucasian man much more extroverted now that both of his parents are not in the room  HENNT: Normal  Lymph nodes: No adenopathy  Breasts: Lungs:Clear to auscultation resonant to percussion   Heart:Regular rhythm no murmur  Abdomen:Soft nontender  Extremities:No edema no calf tenderness  Vascular:No cyanosis  Neurologic:Grossly normal  Skin:No rash or ecchymosis  Lab Results: Lab Results  Component Value Date   WBC 5.9 11/20/2012   HGB 16.1 11/20/2012   HCT 46.8 11/20/2012   MCV 90.2 11/20/2012   PLT 164 11/20/2012     Chemistry      Component Value Date/Time   NA 141 08/22/2012 1449   NA 140 12/18/2006 1055   K 4.2 08/22/2012 1449   K 4.4 12/18/2006 1055   CL 103 08/22/2012 1449   CL 101 12/18/2006 1055   CO2 31* 08/22/2012 1449   CO2 33* 12/18/2006 1055   BUN 15.0 08/22/2012 1449   BUN 11 12/18/2006 1055   CREATININE 0.9 08/22/2012 1449   CREATININE 0.8 12/18/2006 1055      Component Value Date/Time   CALCIUM 9.7 08/22/2012 1449   CALCIUM 9.9 12/18/2006 1055   ALKPHOS 62 08/22/2012 1449   ALKPHOS 75 12/18/2006 1055   AST 19 08/22/2012 1449   AST 23 12/18/2006 1055   ALT 25 08/22/2012 1449   ALT 18 12/18/2006 1055   BILITOT 0.41 08/22/2012 1449   BILITOT 0.8 12/18/2006 1055       Radiological Studies: No results found.  Impression and Plan: Left lower extremity DVT with findings of a congenital risk factor-prothrombin gene mutation, heterozygote.  This is a minor factor for recurrent thrombosis. I am comfortable stopping his Xarelto at the end of this month to complete a total of 6 months of treatment. Although there are no strong data, aspirin does have some benefit on the venous side with about  30% risk reduction and I advised him to start on a 81 mg aspirin tablet when he completes his planned course of Xarelto. He understands if he does have a subsequent clot that this will commit him to long-term anticoagulation. I will see him on an as-needed basis in the future and work with his surgeons if he needs any subsequent surgery to advise on perioperative anticoagulation.   CC:. Dr. Richardson Chiquito, MD 3/22/201412:37 PM

## 2012-12-04 ENCOUNTER — Telehealth: Payer: Self-pay | Admitting: Oncology

## 2012-12-04 NOTE — Telephone Encounter (Signed)
Per 3/21 pof the pt to return as prn.

## 2012-12-19 ENCOUNTER — Encounter: Payer: Self-pay | Admitting: Oncology

## 2013-03-29 ENCOUNTER — Telehealth: Payer: Self-pay | Admitting: Family Medicine

## 2013-03-29 NOTE — Telephone Encounter (Signed)
Okay to see Ms Orvan Falconer if she has an opening

## 2013-03-29 NOTE — Telephone Encounter (Signed)
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 If Dr Tawanna Cooler cannot do, is it ok for pt to see someone else for this? First available 9/8.  Pt also needs PPD and TB.

## 2013-04-01 NOTE — Telephone Encounter (Signed)
Dr Selena Batten, will you see this pt for a CPX? He is going to college and needs prior to 8/10.  Dr Tawanna Cooler out of the office. Will need labs and TB.

## 2013-04-01 NOTE — Telephone Encounter (Signed)
Ok to put in available CPE slot. If needs form completed for college needs to bring all vaccine records from childhood. PPD/TB test takes several days so should come mon,tues or a Wednesday so can return to check skin test.

## 2013-04-01 NOTE — Telephone Encounter (Signed)
appt made/pt aware/kh

## 2013-04-09 ENCOUNTER — Encounter: Payer: Self-pay | Admitting: Family Medicine

## 2013-04-09 ENCOUNTER — Ambulatory Visit (INDEPENDENT_AMBULATORY_CARE_PROVIDER_SITE_OTHER): Payer: PRIVATE HEALTH INSURANCE | Admitting: Family Medicine

## 2013-04-09 VITALS — BP 120/82 | Temp 97.7°F | Ht 70.0 in | Wt 173.0 lb

## 2013-04-09 DIAGNOSIS — H579 Unspecified disorder of eye and adnexa: Secondary | ICD-10-CM

## 2013-04-09 DIAGNOSIS — Z7689 Persons encountering health services in other specified circumstances: Secondary | ICD-10-CM

## 2013-04-09 DIAGNOSIS — Z0184 Encounter for antibody response examination: Secondary | ICD-10-CM

## 2013-04-09 DIAGNOSIS — F411 Generalized anxiety disorder: Secondary | ICD-10-CM

## 2013-04-09 DIAGNOSIS — Z0000 Encounter for general adult medical examination without abnormal findings: Secondary | ICD-10-CM

## 2013-04-09 DIAGNOSIS — Z7189 Other specified counseling: Secondary | ICD-10-CM

## 2013-04-09 DIAGNOSIS — Z111 Encounter for screening for respiratory tuberculosis: Secondary | ICD-10-CM

## 2013-04-09 DIAGNOSIS — Z23 Encounter for immunization: Secondary | ICD-10-CM

## 2013-04-09 LAB — URINALYSIS
Hgb urine dipstick: NEGATIVE
Nitrite: NEGATIVE
Specific Gravity, Urine: <=1.005 (ref 1.000–1.030)
Urine Glucose: NEGATIVE
Urobilinogen, UA: 0.2 (ref 0.0–1.0)

## 2013-04-09 LAB — HEMOGLOBIN: Hemoglobin: 15.8 g/dL (ref 13.0–17.0)

## 2013-04-09 NOTE — Progress Notes (Signed)
Chief Complaint  Patient presents with  . Annual Exam    HPI:  Here for CPE:  -Concerns today: 22 yo pt of Dr. Nelida Stark here for physical today.  1) Wants TB screening: -no high risk in terms of exposure -denies: cough, fevers, weight loss, hemoptysis  2)Needs form completed for school: -form requires labs  3)Anxiety/panic disorder: -with presentations only -takes propranolol  3)prothrombin gene mutation: -followed by heme/onc -on asa daily, no limitations per pt report  -Diet: variety of foods, balance and well rounded  -Exercise:regular exercise  -Diabetes and Dyslipidemia Screening: NA  -Hx of HTN: no  -Vaccines: see orders  -sexual activity: not high risks  -wants STI testing: no  -Alcohol, Tobacco, drug use: see social history  Review of Systems - neg except where stated above  Past Medical History  Diagnosis Date  . Acne   . Allergy   . Asthma   . Anxiety   . DVT, lower extremity 08/08/2012    06/06/12    No family history on file.  History   Social History  . Marital Status: Single    Spouse Name: N/A    Number of Children: N/A  . Years of Education: N/A   Social History Main Topics  . Smoking status: Never Smoker   . Smokeless tobacco: None  . Alcohol Use:   . Drug Use:   . Sexually Active:    Other Topics Concern  . None   Social History Narrative  . None    Current outpatient prescriptions:aspirin 81 MG tablet, Take 81 mg by mouth daily., Disp: , Rfl: ;  finasteride (PROPECIA) 1 MG tablet, Take 1 mg by mouth daily., Disp: , Rfl: ;  Fluticasone Propionate (FLONASE NA), Place into the nose as needed., Disp: , Rfl: ;  propranolol (INDERAL) 20 MG tablet, Take 1 tablet (20 mg total) by mouth 2 (two) times daily. Take 1 or 2 60 minutes prior to public speaking, Disp: 60 tablet, Rfl: 0  EXAM:  Filed Vitals:   04/09/13 0916  BP: 120/82  Temp: 97.7 F (36.5 C)    GENERAL: vitals reviewed and listed below, alert, oriented, appears  well hydrated and in no acute distress  HEENT: head atraumatic, PERRLA, normal appearance of eyes, ears, nose and mouth. moist mucus membranes.  NECK: supple, no masses or lymphadenopathy  LUNGS: clear to auscultation bilaterally, no rales, rhonchi or wheeze  CV: HRRR, no peripheral edema or cyanosis, normal pedal pulses  ABDOMEN: bowel sounds normal, soft, non tender to palpation, no masses, no rebound or guarding  GU: normal, no hernia  RECTAL: refused  SKIN: no rash or abnormal lesions  MS: normal gait, moves all extremities normally  NEURO: CN II-XII grossly intact, normal muscle strength and sensation to light touch on extremities  PSYCH: normal affect, pleasant and cooperative  ASSESSMENT AND PLAN:  Discussed the following assessment and plan:  Health maintenance examination - Plan: Urinalysis, Hemoglobin  PPD screening test - Plan: TB Skin Test  Encounter to establish care  Physical exam  Abnormal vision screen  Anxiety state, unspecified  -Discussed and advised all [place] preventive services health task force level A and B recommendations for age, sex and risks.  -Advised at least 150 minutes of exercise per week and a healthy diet low in saturated fats and sweets and consisting of fresh fruits and vegetables, lean meats such as fish and white chicken and whole grains.  -labs, studies and vaccines per orders this encounter: hgb and urinalysis  for form completion, menveo and 3rd hpv, varicella titer  -advised to see optho for abnormal vision screen  -tb testing, low risks, discussed options if positive  Orders Placed This Encounter  Procedures  . Urinalysis  . Hemoglobin  . TB Skin Test    Order Specific Question:  Has patient ever tested positive?    Answer:  No    There are no Patient Instructions on file for this visit.  Patient advised to return to clinic immediately if symptoms worsen or persist or new concerns.    No Follow-up on file.  Kriste Basque R.

## 2013-04-09 NOTE — Addendum Note (Signed)
Addended by: Azucena Freed on: 04/09/2013 10:03 AM   Modules accepted: Orders

## 2013-04-10 LAB — VARICELLA ZOSTER ANTIBODY, IGG: Varicella IgG: 134.9 Index (ref <135.00)

## 2013-04-11 LAB — TB SKIN TEST: TB Skin Test: NEGATIVE

## 2013-04-11 NOTE — Progress Notes (Signed)
Quick Note:  Spoke with pt ______ 

## 2013-04-11 NOTE — Progress Notes (Signed)
Quick Note:  Pt came in to office; pt is aware. ______

## 2013-04-11 NOTE — Addendum Note (Signed)
Addended by: Azucena Freed on: 04/11/2013 09:40 AM   Modules accepted: Orders

## 2013-04-17 ENCOUNTER — Encounter: Payer: Self-pay | Admitting: Family Medicine

## 2013-04-25 ENCOUNTER — Telehealth: Payer: Self-pay | Admitting: Family Medicine

## 2013-04-25 MED ORDER — LISDEXAMFETAMINE DIMESYLATE 20 MG PO CAPS: 20.0000 mg | ORAL_CAPSULE | Freq: Two times a day (BID) | ORAL | Status: DC

## 2013-04-25 NOTE — Telephone Encounter (Signed)
Pt needs new rx vyvanse 20 mg twice a day.

## 2013-04-25 NOTE — Telephone Encounter (Signed)
Left message on machine Rx ready for pick up 

## 2013-06-24 ENCOUNTER — Encounter: Payer: Self-pay | Admitting: Oncology

## 2013-08-05 ENCOUNTER — Other Ambulatory Visit: Payer: Self-pay | Admitting: Family Medicine

## 2013-09-10 ENCOUNTER — Ambulatory Visit (INDEPENDENT_AMBULATORY_CARE_PROVIDER_SITE_OTHER): Payer: PRIVATE HEALTH INSURANCE | Admitting: Family Medicine

## 2013-09-10 ENCOUNTER — Encounter: Payer: Self-pay | Admitting: Family Medicine

## 2013-09-10 VITALS — BP 110/80 | Temp 97.6°F | Wt 173.0 lb

## 2013-09-10 DIAGNOSIS — F411 Generalized anxiety disorder: Secondary | ICD-10-CM

## 2013-09-10 DIAGNOSIS — L659 Nonscarring hair loss, unspecified: Secondary | ICD-10-CM

## 2013-09-10 DIAGNOSIS — F908 Attention-deficit hyperactivity disorder, other type: Secondary | ICD-10-CM

## 2013-09-10 DIAGNOSIS — F909 Attention-deficit hyperactivity disorder, unspecified type: Secondary | ICD-10-CM

## 2013-09-10 DIAGNOSIS — J309 Allergic rhinitis, unspecified: Secondary | ICD-10-CM

## 2013-09-10 MED ORDER — ALBUTEROL SULFATE HFA 108 (90 BASE) MCG/ACT IN AERS: 2.0000 | INHALATION_SPRAY | Freq: Four times a day (QID) | RESPIRATORY_TRACT | Status: AC | PRN

## 2013-09-10 MED ORDER — FINASTERIDE 5 MG PO TABS: 5.0000 mg | ORAL_TABLET | Freq: Every day | ORAL | Status: DC

## 2013-09-10 MED ORDER — FLUTICASONE PROPIONATE 50 MCG/ACT NA SUSP: NASAL | Status: DC

## 2013-09-10 NOTE — Progress Notes (Signed)
Pre visit review using our clinic review tool, if applicable. No additional management support is needed unless otherwise documented below in the visit note. 

## 2013-09-10 NOTE — Progress Notes (Signed)
   Subjective:    Patient ID: Wesley Stark, male    DOB: 06-06-1991, 22 y.o.   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 He feels like his focus and concentration is okay without medication.  He takes Ativan 0.5 and propranolol 20 mg when necessary for speeches  He has a lesion on his left side of his upper back he would like checked  He also has allergic rhinitis for which he uses steroid nasal spray. He still has some postnasal drip. He had an ENT evaluation recently because he has a nasal septal repair. That exam was normal except for some postnasal drip.  He also has a history of occasional asthma when he is exposed to something that might be a trigger. Is able exercise and function normal. It seems mild at this juncture   Review of Systems    review of systems otherwise negative Objective:   Physical Exam Well-developed and nourished male no acute distress HEENT negative neck was supple the lesion on the back is a sebaceous cyst.  After cleaning with alcohol and he incision was made with a sterile needle and the cyst was excised in toto a Band-Aid was applied  Cardiopulmonary exam normal       Assessment & Plan:  Allergic rhinitis,,,,,,,,,,, plain Zyrtec each bedtime along with a steroid nasal spray  Occasional asthma....... albuterol when necessary if symptoms get worse or the albuterol doesn't work then we'll pursue workup  Adult ADD,,,,,,,,,, discontinue medication  Sebaceous cyst removed...........,

## 2013-09-10 NOTE — Patient Instructions (Signed)
Finasteride 5 mg........... use as directed  For the allergic rhinitis I would take a plane 10 mg Zyrtec at bedtime along with one shot of a steroid nasal spray up each nostril at bedtime  Albuterol 1-2+. For wheezing.............. if you have to use this more than 3 or 4 times per week or the medication doesn't work let us know and we will pursue a workup  Stop the vyvanse  Return in one year sooner if any problems.

## 2013-11-09 ENCOUNTER — Encounter: Payer: Self-pay | Admitting: Oncology

## 2013-11-26 ENCOUNTER — Telehealth: Payer: Self-pay | Admitting: Family Medicine

## 2013-11-26 NOTE — Telephone Encounter (Addendum)
Pt needs new rx vyvanse 20 mg once daily. This prescribed was originally written by dr Kirtland Bouchardk . Dr Tawanna Coolertodd was out of office

## 2013-11-26 NOTE — Telephone Encounter (Signed)
Left message on machine for patient to return our call.  Per last office note patient had stopped his Vyvanse.

## 2013-11-27 NOTE — Telephone Encounter (Signed)
Patient left message that is his having difficulties focusing.  He would like to try a lower dose.  Okay per Dr Tawanna Coolerodd.  Left message on machine for patient to return our call

## 2013-11-28 ENCOUNTER — Encounter: Payer: Self-pay | Admitting: Family Medicine

## 2013-11-28 MED ORDER — LISDEXAMFETAMINE DIMESYLATE 10 MG PO CAPS: 10.0000 mg | ORAL_CAPSULE | Freq: Two times a day (BID) | ORAL | Status: DC

## 2013-11-28 NOTE — Telephone Encounter (Signed)
Rx ready for pick up.  Message sent to MyChart

## 2014-01-28 ENCOUNTER — Encounter: Payer: Self-pay | Admitting: Family Medicine

## 2014-01-28 ENCOUNTER — Ambulatory Visit (INDEPENDENT_AMBULATORY_CARE_PROVIDER_SITE_OTHER): Payer: PRIVATE HEALTH INSURANCE | Admitting: Family Medicine

## 2014-01-28 VITALS — BP 110/80 | Temp 98.0°F | Wt 170.0 lb

## 2014-01-28 DIAGNOSIS — F411 Generalized anxiety disorder: Secondary | ICD-10-CM

## 2014-01-28 DIAGNOSIS — F908 Attention-deficit hyperactivity disorder, other type: Secondary | ICD-10-CM

## 2014-01-28 DIAGNOSIS — F909 Attention-deficit hyperactivity disorder, unspecified type: Secondary | ICD-10-CM

## 2014-01-28 MED ORDER — BUPROPION HCL 75 MG PO TABS: 75.0000 mg | ORAL_TABLET | Freq: Two times a day (BID) | ORAL | Status: DC

## 2014-01-28 NOTE — Progress Notes (Signed)
   Subjective:    Patient ID: Wesley Stark, male    DOB: 09/02/1991, 23 y.o.   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 At one time he took some Celexa with help with the anxiety and depression during the winter  We talked about various options Will try a child of Wellbutrin   Review of Systems Negative    Objective:   Physical Exam  Well-developed well-nourished male no acute distress vital signs stable he is afebrile      Assessment & Plan:  Adult ADD,,,,,,,, continue medicine during the school year  History of anxiety/depression also seasonal affective disorder,,,,,,, trial of Wellbutrin

## 2014-01-28 NOTE — Patient Instructions (Signed)
Wellbutrin 75 mg............Marland Kitchen. 1 daily in the morning  Call in 4 weeks with a progress report,,,,,,,,,,, extension [phone] is [employer] voicemail.

## 2014-01-28 NOTE — Progress Notes (Signed)
Pre visit review using our clinic review tool, if applicable. No additional management support is needed unless otherwise documented below in the visit note. 

## 2014-02-05 ENCOUNTER — Other Ambulatory Visit: Payer: Self-pay | Admitting: Family Medicine

## 2014-03-05 ENCOUNTER — Encounter: Payer: Self-pay | Admitting: Family Medicine

## 2014-03-11 ENCOUNTER — Telehealth: Payer: Self-pay | Admitting: Family Medicine

## 2014-03-11 MED ORDER — BUPROPION HCL ER (SR) 100 MG PO TB12: 100.0000 mg | ORAL_TABLET | Freq: Two times a day (BID) | ORAL | Status: DC

## 2014-03-11 NOTE — Telephone Encounter (Signed)
rx changed from 75 mg to 100 sr sent to pharm, and pt notified

## 2014-03-11 NOTE — Telephone Encounter (Signed)
Pt would like to speak with you regarding his rx bupropion (wellbutrin) 75 mg, states the 75 mg is not covered by his insurance and he would like to know if he can get an extended release product.

## 2014-03-13 ENCOUNTER — Telehealth: Payer: Self-pay | Admitting: Family Medicine

## 2014-03-13 MED ORDER — BUPROPION HCL ER (SR) 100 MG PO TB12: 100.0000 mg | ORAL_TABLET | Freq: Two times a day (BID) | ORAL | Status: DC

## 2014-03-13 NOTE — Telephone Encounter (Signed)
Pt needs new rx buPROPion (WELLBUTRIN SR) 100 MG 12 hr tablet sent to [facility], pt states this is the only pharmacy that will be covered by his insurance. [phone]

## 2014-03-13 NOTE — Telephone Encounter (Signed)
[facility]

## 2014-03-13 NOTE — Telephone Encounter (Signed)
LMOM for pt informing him the medication doesn't require a PA.  Per his plan, any maintenance medication needs to go through [facility].  He can call his insurance company to find out more information as to what locations he would like to utilize.

## 2014-03-13 NOTE — Telephone Encounter (Signed)
Pt needs PA on bupropion 100 mg please call [phone]

## 2014-04-15 ENCOUNTER — Telehealth: Payer: Self-pay | Admitting: Family Medicine

## 2014-04-15 NOTE — Telephone Encounter (Signed)
Pt request refill  Lisdexamfetamine Dimesylate (VYVANSE)  Pt would like to increase to 20 mg 2 x day from 10 mg due to going back to school.

## 2014-04-17 MED ORDER — LISDEXAMFETAMINE DIMESYLATE 20 MG PO CAPS: 20.0000 mg | ORAL_CAPSULE | Freq: Every day | ORAL | Status: DC

## 2014-04-17 MED ORDER — LISDEXAMFETAMINE DIMESYLATE 20 MG PO CAPS
20.0000 mg | ORAL_CAPSULE | Freq: Two times a day (BID) | ORAL | Status: DC
Start: 2014-04-17 — End: 2014-09-18

## 2014-04-17 MED ORDER — LISDEXAMFETAMINE DIMESYLATE 20 MG PO CAPS
20.0000 mg | ORAL_CAPSULE | Freq: Every day | ORAL | Status: DC
Start: 2014-04-17 — End: 2014-09-18

## 2014-04-17 NOTE — Telephone Encounter (Signed)
rx ready for pick up and Left message on machine for patient 

## 2014-05-27 ENCOUNTER — Other Ambulatory Visit: Payer: Self-pay | Admitting: Family Medicine

## 2014-06-27 ENCOUNTER — Other Ambulatory Visit: Payer: Self-pay

## 2014-06-30 ENCOUNTER — Other Ambulatory Visit: Payer: Self-pay | Admitting: Family Medicine

## 2014-09-18 ENCOUNTER — Encounter: Payer: Self-pay | Admitting: Family Medicine

## 2014-09-18 ENCOUNTER — Ambulatory Visit (INDEPENDENT_AMBULATORY_CARE_PROVIDER_SITE_OTHER): Payer: PRIVATE HEALTH INSURANCE | Admitting: Family Medicine

## 2014-09-18 ENCOUNTER — Encounter: Payer: Self-pay | Admitting: *Deleted

## 2014-09-18 VITALS — BP 110/70 | Temp 97.8°F | Wt 159.0 lb

## 2014-09-18 DIAGNOSIS — F411 Generalized anxiety disorder: Secondary | ICD-10-CM

## 2014-09-18 NOTE — Progress Notes (Signed)
Pre visit review using our clinic review tool, if applicable. No additional management support is needed unless otherwise documented below in the visit note. 

## 2014-09-18 NOTE — Patient Instructions (Signed)
Continue the aspirin therapy daily  Follow-up when necessary  EKG normal  TSH pending

## 2014-09-18 NOTE — Progress Notes (Signed)
   Subjective:    Patient ID: Wesley Stark, male    DOB: 05/23/1991, 24 y.o.   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 He was on xeralto for 6 months. He had a hematologic workup by Dr. Oletta LamasGraham Fortuna that showed he is heterozygous for the prothrombin gene mutation.  Dr. Rosanne AshingJim recommended anticoagulation for 6 months then switching to aspirin daily. However he if he would have a recurrent clot then he would be committed to lifelong anticoagulation  He recently woke up with some leg pain and [facility] . Study shows no clot a Neff left leg.  He's also currently working with one of the psychiatrist it [employer] [facility] [employer] concerning his symptoms of anxiety. 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 clocked lower extremity left 1..... No evidence of recurrence...Marland Kitchen.Marland Kitchen.Marland Kitchen. continue aspirin  Anxiety........ continue follow-up with the folks at [facility] [employer]

## 2014-09-19 LAB — TSH: TSH: 0.65 u[IU]/mL (ref 0.35–4.50)

## 2014-09-24 ENCOUNTER — Telehealth: Payer: Self-pay | Admitting: Family Medicine

## 2014-09-24 NOTE — Telephone Encounter (Signed)
° ° °  CHANGED MINE

## 2015-03-13 ENCOUNTER — Other Ambulatory Visit: Payer: Self-pay | Admitting: Family Medicine

## 2015-04-22 ENCOUNTER — Ambulatory Visit (INDEPENDENT_AMBULATORY_CARE_PROVIDER_SITE_OTHER): Payer: No Typology Code available for payment source | Admitting: Licensed Clinical Social Worker

## 2015-04-22 DIAGNOSIS — F4322 Adjustment disorder with anxiety: Secondary | ICD-10-CM | POA: Diagnosis not present

## 2015-04-24 ENCOUNTER — Other Ambulatory Visit: Payer: Self-pay | Admitting: Family Medicine

## 2015-05-05 ENCOUNTER — Ambulatory Visit (INDEPENDENT_AMBULATORY_CARE_PROVIDER_SITE_OTHER): Payer: PRIVATE HEALTH INSURANCE | Admitting: Adult Health

## 2015-05-05 ENCOUNTER — Encounter: Payer: Self-pay | Admitting: Adult Health

## 2015-05-05 VITALS — BP 128/80 | Temp 98.4°F | Ht 70.0 in | Wt 170.7 lb

## 2015-05-05 DIAGNOSIS — R002 Palpitations: Secondary | ICD-10-CM

## 2015-05-05 DIAGNOSIS — R0602 Shortness of breath: Secondary | ICD-10-CM | POA: Diagnosis not present

## 2015-05-05 NOTE — Progress Notes (Signed)
   Subjective:    Patient ID: Wesley Stark, male    DOB: January 07, 1991, 24 y.o.   MRN: [id number]  HPI 24 year old male, patient of Dr. Tawanna Cooler who presents to the office today for " to have my lungs checked out." He endorses having SOB for the last year, mostly when he is walking around or talking on the phone. He only uses an albuterol inhaler for asthma and was surprised in the office today when I asked him about his history of asthma, he states " I didn't know I had a history of asthma, no one ever told me that", he has not used his inhaler in months.   He also endorses that he has periods of where he feels like he has palpitations. There is no contributing factor to his palpitations as they can happen while he is in class or while he is walking around. This has been an ongoing issue with him and has been happening for a year or more.   He is followed by psychiatry at [facility] or his anxiety. 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     Review of Systems  Constitutional: Negative.   Respiratory: Positive for shortness of breath. Negative for apnea, cough, choking, chest tightness and wheezing.   Cardiovascular: Positive for palpitations. Negative for chest pain and leg swelling.  Neurological: Negative.   Psychiatric/Behavioral: Negative for suicidal ideas, sleep disturbance, self-injury and agitation. The patient is nervous/anxious and is hyperactive.   All other systems reviewed and are negative.      Objective:   Physical Exam  Constitutional: He is oriented to person, place, and time. He appears well-developed and well-nourished. No distress.  Cardiovascular: Normal rate, regular rhythm, normal heart sounds and intact distal pulses.  Exam reveals no gallop and no friction rub.   No murmur heard. Pulmonary/Chest: Effort normal and breath sounds normal. No respiratory distress. He has no wheezes. He has no rales. He exhibits no tenderness.    Musculoskeletal: Normal range of motion. He exhibits no edema or tenderness.  Neurological: He is alert and oriented to person, place, and time.  Skin: Skin is warm and dry. No rash noted. He is not diaphoretic. No erythema. No pallor.  Psychiatric: He has a normal mood and affect. His behavior is normal. Judgment and thought content normal.  Nursing note and vitals reviewed.     Assessment & Plan:  1. SOB (shortness of breath) - Unknown cause of his SOB. Lungs clear on exam. Mostly likely from asthma. Advised to use inhaler when he felt SOB or wheezy. Consider sending to Pulmonology for further testing.   2. Palpitations - Likely anxiety induced.  - EKG 12-Lead - SR, rate 89 - Consider Holter monitor, patient does not want to pursue this at current time.  - Continue to follow up with Psych.

## 2015-05-05 NOTE — Progress Notes (Signed)
Pre visit review using our clinic review tool, if applicable. No additional management support is needed unless otherwise documented below in the visit note. 

## 2015-05-05 NOTE — Patient Instructions (Signed)
It was great meeting you today!  Please start using your inhaler when you feel wheezy. Your lungs were clear today in the office.   Continue to follow up with your doctors at [facility].   Keep your head up! If you need anything, let myself or Dr. Tawanna Cooler know.

## 2016-06-23 ENCOUNTER — Other Ambulatory Visit: Payer: Self-pay | Admitting: Family Medicine

## 2016-07-05 NOTE — Telephone Encounter (Signed)
Called and left voice mail for pt to return call to office. Appointment is needed for further refills.

## 2017-06-02 ENCOUNTER — Encounter: Payer: Self-pay | Admitting: Family Medicine

## 2019-08-14 ENCOUNTER — Emergency Department (HOSPITAL_BASED_OUTPATIENT_CLINIC_OR_DEPARTMENT_OTHER): Payer: PRIVATE HEALTH INSURANCE

## 2019-08-14 ENCOUNTER — Emergency Department (HOSPITAL_BASED_OUTPATIENT_CLINIC_OR_DEPARTMENT_OTHER)
Admission: EM | Admit: 2019-08-14 | Discharge: 2019-08-14 | Disposition: A | Discharge status: Home / Self Care | Payer: PRIVATE HEALTH INSURANCE | Attending: Emergency Medicine | Admitting: Emergency Medicine

## 2019-08-14 ENCOUNTER — Encounter (HOSPITAL_BASED_OUTPATIENT_CLINIC_OR_DEPARTMENT_OTHER): Payer: Self-pay

## 2019-08-14 ENCOUNTER — Other Ambulatory Visit: Payer: Self-pay

## 2019-08-14 DIAGNOSIS — R0602 Shortness of breath: Secondary | ICD-10-CM | POA: Insufficient documentation

## 2019-08-14 DIAGNOSIS — Z7982 Long term (current) use of aspirin: Secondary | ICD-10-CM | POA: Diagnosis not present

## 2019-08-14 DIAGNOSIS — M79662 Pain in left lower leg: Secondary | ICD-10-CM | POA: Insufficient documentation

## 2019-08-14 DIAGNOSIS — I82452 Acute embolism and thrombosis of left peroneal vein: Secondary | ICD-10-CM | POA: Diagnosis not present

## 2019-08-14 DIAGNOSIS — R609 Edema, unspecified: Secondary | ICD-10-CM | POA: Diagnosis present

## 2019-08-14 HISTORY — DX: Prothrombin gene mutation: D68.52

## 2019-08-14 LAB — BASIC METABOLIC PANEL
Anion gap: 10 (ref 5–15)
BUN: 15 mg/dL (ref 6–20)
CO2: 27 mmol/L (ref 22–32)
Calcium: 9.5 mg/dL (ref 8.9–10.3)
Chloride: 101 mmol/L (ref 98–111)
Creatinine, Ser: 0.87 mg/dL (ref 0.61–1.24)
GFR calc Af Amer: >60 mL/min (ref >60)
GFR calc non Af Amer: >60 mL/min (ref >60)
Glucose, Bld: 108 mg/dL — ABNORMAL HIGH (ref 70–99)
Potassium: 4.1 mmol/L (ref 3.5–5.1)
Sodium: 138 mmol/L (ref 135–145)

## 2019-08-14 LAB — CBC WITH DIFFERENTIAL/PLATELET
Abs Immature Granulocytes: 0.01 10*3/uL (ref 0.00–0.07)
Basophils Absolute: 0 10*3/uL (ref 0.0–0.1)
Basophils Relative: 1 %
Eosinophils Absolute: 0.1 10*3/uL (ref 0.0–0.5)
Eosinophils Relative: 2 %
HCT: 46.3 % (ref 39.0–52.0)
Hemoglobin: 15.3 g/dL (ref 13.0–17.0)
Immature Granulocytes: 0 %
Lymphocytes Relative: 33 %
Lymphs Abs: 2.2 10*3/uL (ref 0.7–4.0)
MCH: 30.1 pg (ref 26.0–34.0)
MCHC: 33 g/dL (ref 30.0–36.0)
MCV: 91 fL (ref 80.0–100.0)
Monocytes Absolute: 0.3 10*3/uL (ref 0.1–1.0)
Monocytes Relative: 4 %
Neutro Abs: 3.9 10*3/uL (ref 1.7–7.7)
Neutrophils Relative %: 60 %
Platelets: 207 10*3/uL (ref 150–400)
RBC: 5.09 MIL/uL (ref 4.22–5.81)
RDW: 11.8 % (ref 11.5–15.5)
WBC: 6.5 10*3/uL (ref 4.0–10.5)
nRBC: 0 % (ref 0.0–0.2)

## 2019-08-14 MED ORDER — RIVAROXABAN (XARELTO) VTE STARTER PACK (15 & 20 MG): ORAL_TABLET | ORAL | 0 refills | Status: AC

## 2019-08-14 MED ORDER — RIVAROXABAN 15 MG PO TABS
15.0000 mg | ORAL_TABLET | Freq: Once | ORAL | Status: AC
  Administered 2019-08-14: 21:00:00 15 mg via ORAL
  Filled 2019-08-14: qty 1

## 2019-08-14 MED ORDER — IOHEXOL 350 MG/ML SOLN
100.0000 mL | Freq: Once | INTRAVENOUS | Status: AC | PRN
  Administered 2019-08-14: 100 mL via INTRAVENOUS

## 2019-08-14 MED ORDER — RIVAROXABAN (XARELTO) EDUCATION KIT FOR DVT/PE PATIENTS
PACK | Freq: Once | Status: AC
  Administered 2019-08-14: 21:00:00

## 2019-08-14 NOTE — ED Notes (Signed)
Returned from u/s, ambulating with steady gait

## 2019-08-14 NOTE — ED Triage Notes (Signed)
Pt c/o swelling/redness pain to left LE x 2 days-denies injury-hx of DVT to leg 2013-NAD-steady gait

## 2019-08-14 NOTE — ED Provider Notes (Signed)
[facility] EMERGENCY DEPARTMENT Provider Note   CSN: [id number] Arrival date & time: 08/14/19  1727     History   Chief Complaint Chief Complaint  Patient presents with  . Leg Swelling    HPI Wesley Stark is a 28 y.o. male with a pmh of prothrombin gene mutation and previous unprovoked LLE dvt in 2013 who presents to the ED with LLE swelling. The patient states that 2 days ago he noticed swelling in the left foot and ankle. It seemed to improve overnight, but has progressively worsened . He now notices ne telangectasia on the medial ankle. He has minimal SOB. He denies syncope, hemoptysis. He is currently only in 81 mg ASA.       HPI  Past Medical History:  Diagnosis Date  . Acne   . Allergy   . Anxiety   . Asthma   . DVT, lower extremity ([place]) 08/08/2012   06/06/12  . Prothrombin gene mutation [facility])     Patient Active Problem List   Diagnosis Date Noted  . Prothrombin gene mutation ([place]) 12/01/2012  . DVT, lower extremity ([place]) 08/08/2012  . ADHD, adult residual type 01/18/2011  . Anxiety state 11/17/2008  . ALLERGIC RHINITIS 06/23/2008  . ASTHMA 06/23/2008    Past Surgical History:  Procedure Laterality Date  . finger sugery     right middle finger  . KNEE SURGERY     x 2 - left  . NOSE SURGERY          Home Medications    Prior to Admission medications   Medication Sig Start Date End Date Taking? Authorizing Provider  albuterol (PROVENTIL HFA;VENTOLIN HFA) 108 (90 BASE) MCG/ACT inhaler Inhale 2 puffs into the lungs every 6 (six) hours as needed for wheezing or shortness of breath. Patient not taking: Reported on 05/05/2015 09/10/13   Dorena Cookey, MD  arginine 500 MG tablet Take 3 tablets by mouth daily    [provider]  aspirin 81 MG tablet Take 81 mg by mouth daily.    [provider]  azelastine (ASTELIN) 0.1 % nasal spray Place 1 spray into both nostrils 2 (two) times daily. Use in each nostril as directed     [provider]  finasteride (PROSCAR) 5 MG tablet TAKE 1 TABLET BY MOUTH DAILY. 03/13/15   Dorena Cookey, MD  FLUoxetine (PROZAC) 20 MG capsule Take 20 mg by mouth at bedtime.    [provider]  fluticasone (FLONASE) 50 MCG/ACT nasal spray USE 1 SPRAY UP EACH NOSTRIL EACH BEDTIME 04/24/15   Dorena Cookey, MD  Magnesium 400 MG CAPS Take 400 mg by mouth daily.    [provider]  methylphenidate 18 MG PO CR tablet Take 18 mg by mouth daily.    [provider]  propranolol (INDERAL) 20 MG tablet TAKE 1 TABLET EVERY DAY AS NEEDED 02/05/14   Dorena Cookey, MD    Family History No family history on file.  Social History Social History   Tobacco Use  . Smoking status: Never Smoker  . Smokeless tobacco: Never Used  Substance Use Topics  . Alcohol use: Yes    Comment: occ  . Drug use: Never     Allergies   Patient has no known allergies.   Review of Systems Review of Systems Ten systems reviewed and are negative for acute change, except as noted in the HPI.    Physical Exam Updated Vital Signs BP 121/79 (BP Location:  Left Arm)   Pulse (!) 101   Temp 99.5 F (37.5 C) (Oral)   Resp 20   Ht 5\' 10"  (1.778 m)   Wt 74.8 kg   SpO2 100%   BMI 23.68 kg/m   Physical Exam Vitals signs and nursing note reviewed.  Constitutional:      General: He is not in acute distress.    Appearance: He is well-developed. He is not diaphoretic.  HENT:     Head: Normocephalic and atraumatic.  Eyes:     General: No scleral icterus.    Conjunctiva/sclera: Conjunctivae normal.  Neck:     Musculoskeletal: Normal range of motion and neck supple.  Cardiovascular:     Rate and Rhythm: Normal rate and regular rhythm.     Heart sounds: Normal heart sounds.  Pulmonary:     Effort: Pulmonary effort is normal. No respiratory distress.     Breath sounds: Normal breath sounds.  Abdominal:     Palpations: Abdomen is soft.     Tenderness: There is no abdominal  tenderness.  Musculoskeletal:     Left lower leg: Edema present.     Comments: Left lower extremity with swelling of the foot, ankle and calf.  No calf tenderness.  There is some telangiectasia along the instep and medial ankle care.  Mild erythema without warmth, tenderness.  Skin:    General: Skin is warm and dry.  Neurological:     Mental Status: He is alert.  Psychiatric:        Behavior: Behavior normal.      ED Treatments / Results  Labs (all labs ordered are listed, but only abnormal results are displayed) Labs Reviewed - No data to display  EKG None  Radiology No results found.  Procedures Procedures (including critical care time)  Medications Ordered in ED Medications - No data to display   Initial Impression / Assessment and Plan / ED Course  I have reviewed the triage vital signs and the nursing notes.  Pertinent labs & imaging results that were available during my care of the patient were reviewed by me and considered in my medical decision making (see chart for details).        27 year old male with known prothrombin gene mutation, previous unprovoked left lower extremity DVT on 81 mg of aspirin daily who presents with acute onset left lower extremity swelling and heaviness concerning for new DVT.  Ultrasound evaluation of the leg shows an acute DVT which I discussed with the radiologist Dr. 37.. I personally reviewed the patient's labs which show mildly elevated blood glucose without other metabolic abnormalities.  CBC shows no abnormalities, I personally reviewed the patient's DVT ultrasound which shows noncompressible peroneal vein on my interpretation.  CT angiogram of the chest shows no evidence of pulmonary embolus or other acute abnormalities on my interpretation. Patient started on Xarelto.  He is advised to follow closely with his primary care physician.  Is hemodynamically stable and appears appropriate for discharge at this time.I  discussed return precautions with the patient  Final Clinical Impressions(s) / ED Diagnoses   Final diagnoses:  None    ED Discharge Orders    None       Narda Rutherford, PA-C 08/14/19 2312    2313, MD 08/17/19 [phone]

## 2019-08-14 NOTE — ED Notes (Signed)
Pt to u/s

## 2019-08-14 NOTE — Discharge Instructions (Signed)
Get help right away if: You have chest pain or shortness of breath. You cough up blood. You have a rapid or irregular heartbeat. You feel light-headed or dizzy. You have new or increased pain, swelling, or redness in an arm or leg. You have numbness or tingling in an arm or leg. You have blood in your vomit, stool, or urine.

## 2019-08-29 ENCOUNTER — Telehealth: Payer: PRIVATE HEALTH INSURANCE | Admitting: Physician Assistant

## 2019-08-29 DIAGNOSIS — I82402 Acute embolism and thrombosis of unspecified deep veins of left lower extremity: Secondary | ICD-10-CM

## 2019-08-29 NOTE — Progress Notes (Signed)
   Based on what you shared with me, I feel your condition warrants further evaluation and I recommend that you be seen for a face to face visit.  Please contact your primary care physician practice to be seen. Many offices offer virtual options to be seen via video if you are not comfortable going in person to a medical facility at this time.   You also have the option of a video visit through [email or web address]  If you are having a true medical emergency please call 911.  NOTE: If you entered your credit card information for this eVisit, you will not be charged. You may see a "hold" on your card for the $35 but that hold will drop off and you will not have a charge processed.  Your e-visit answers were reviewed by a board certified advanced clinical practitioner to complete your personal care plan.  Thank you for using e-Visits. Based on what you shared with me, I feel your condition warrants further evaluation and I recommend that you be seen for a face to face office visit.   NOTE: If you entered your credit card information for this eVisit, you will not be charged. You may see a "hold" on your card for the $35 but that hold will drop off and you will not have a charge processed.   If you are having a true medical emergency please call 911.      For an urgent face to face visit, [facility] has five urgent care centers for your convenience:      NEW:  [facility] [place] [facility] [place] Get Driving Directions [phone] [place], [place] [address] . 10 am - 6pm Monday - Friday    [place] [facility]) Get Driving Directions [phone] [address] [place], [place] [address] . 10 am to 8 pm Monday-Friday . 12 pm to 8 pm [facility] Urgent Care at [facility] Get Driving Directions [phone] [place], [place], [place] [address] . 8 am to 8 pm  Monday-Friday . 9 am to 6 pm Saturday . 11 am to 6 pm Sunday     [facility] at [facility] Get Driving Directions  [phone] [address] [place], [place] [address] . 8 am to 8 pm Monday-Friday . 8 am to 4 pm [facility] Urgent Care at  Get Driving Directions [phone] [address] [place], [place] [address] . 12 pm to 6 pm Monday-Friday      Your e-visit answers were reviewed by a board certified advanced clinical practitioner to complete your personal care plan.  Thank you for using e-Visits.   Particia Nearing PA-C  Approximately 5 minutes was spent documenting and reviewing patient's chart.

## 2021-06-25 IMAGING — CT CT ANGIO CHEST
2 of 8 series · 19 of 36 positions shown · IV contrast (Omnipaque)
Comparison: None.

CLINICAL DATA: Shortness of breath.

EXAM:
CT ANGIOGRAPHY CHEST WITH CONTRAST
TECHNIQUE: Multidetector CT imaging of the chest was performed using the
standard protocol during bolus administration of intravenous
contrast. Multiplanar CT image reconstructions and MIPs were
obtained to evaluate the vascular anatomy.
CONTRAST:  75 mL OMNIPAQUE IOHEXOL 350 MG/ML SOLN

[Series 7: pe coronal mpr · coronal · 0.58mm/px · 1 of 129 slices shown]
[im 65/129  mediastinal]
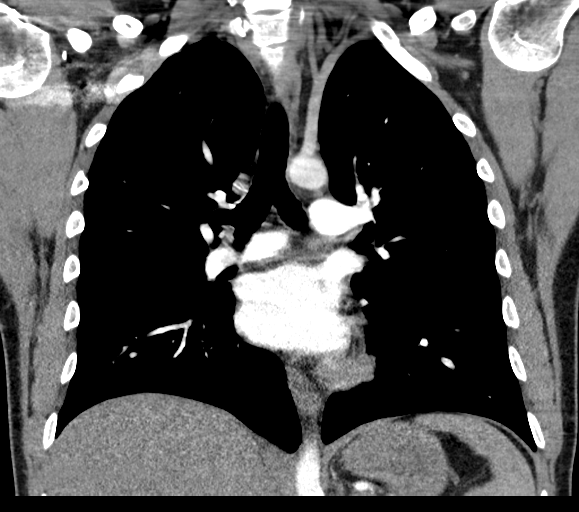

[Series 11: pe thins · axial · 0.73mm/px · z∈[-219,+59]mm · 18 of 312 slices shown]
[im 17/312  lung]
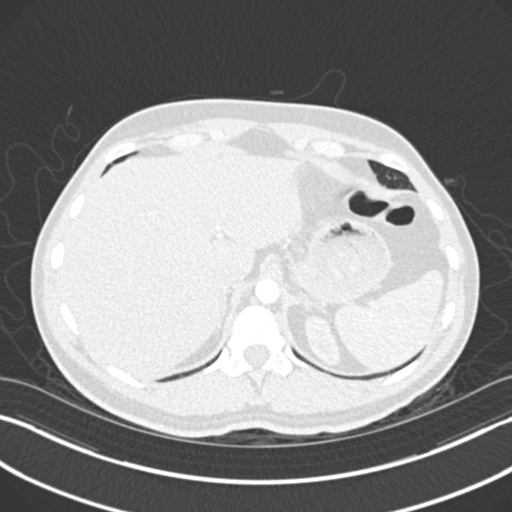
[im 33/312  mediastinal]
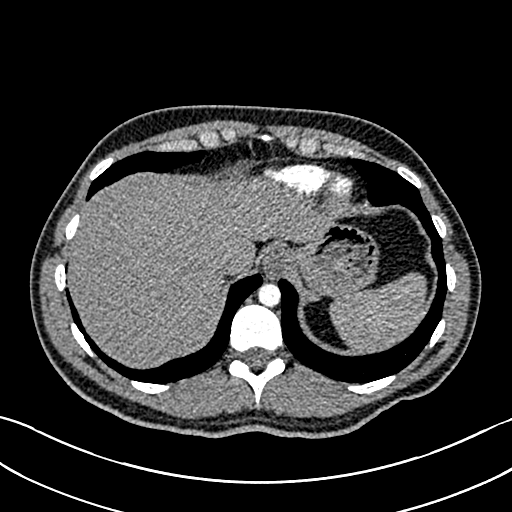
[im 50/312  lung]
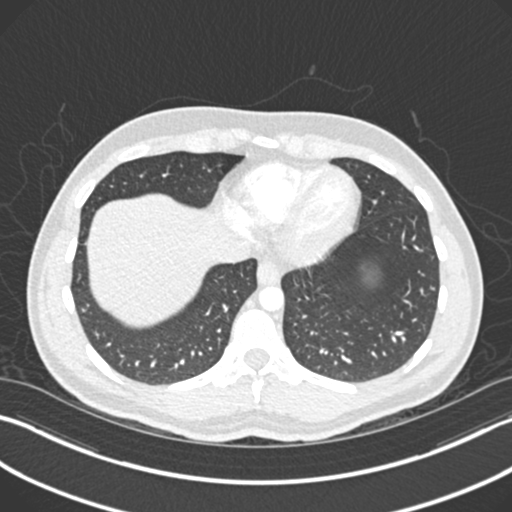
[im 66/312  mediastinal]
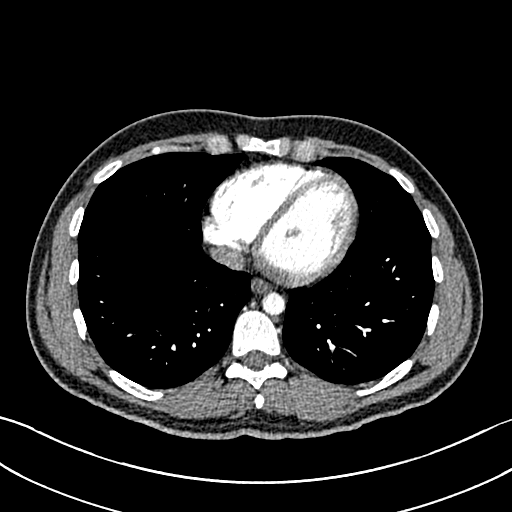
[im 82/312  lung]
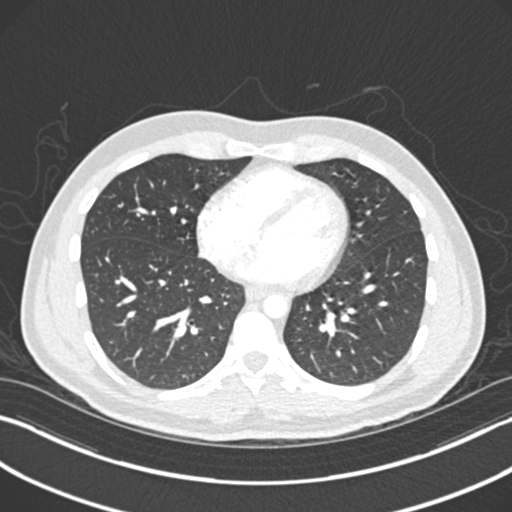
[im 99/312  mediastinal]
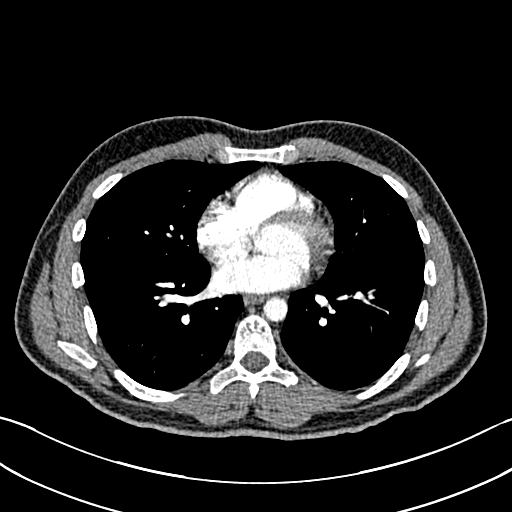
[im 115/312  lung]
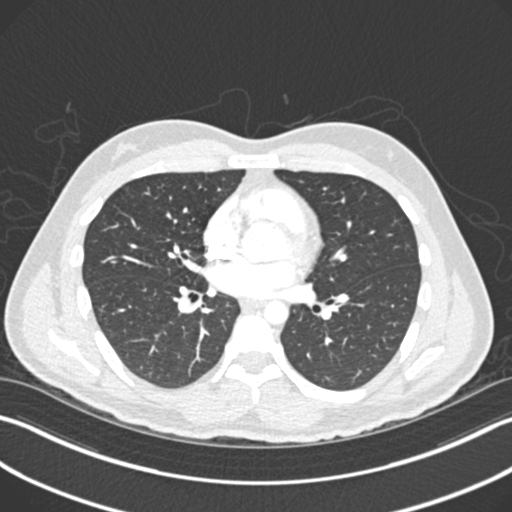
[im 131/312  mediastinal]
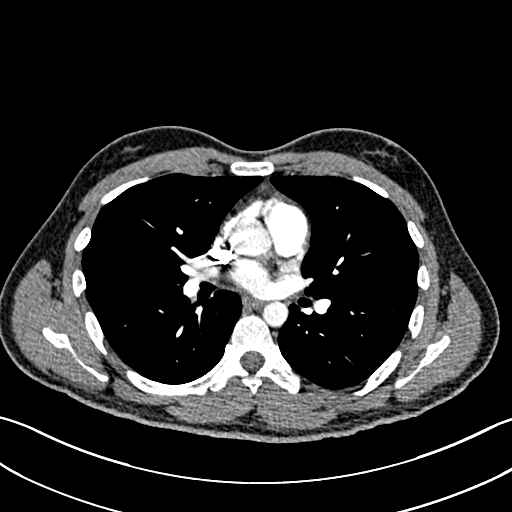
[im 148/312  lung]
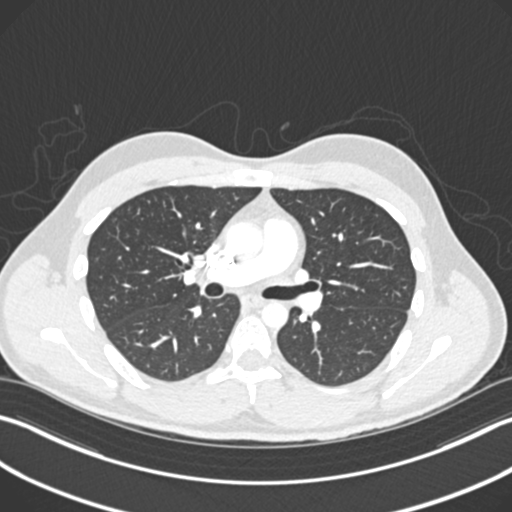
[im 164/312  mediastinal]
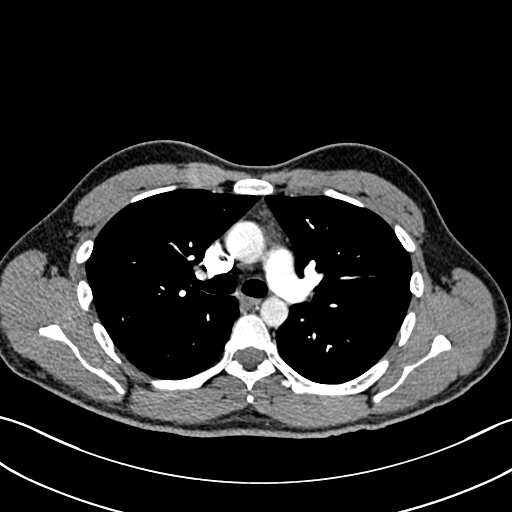
[im 181/312  lung]
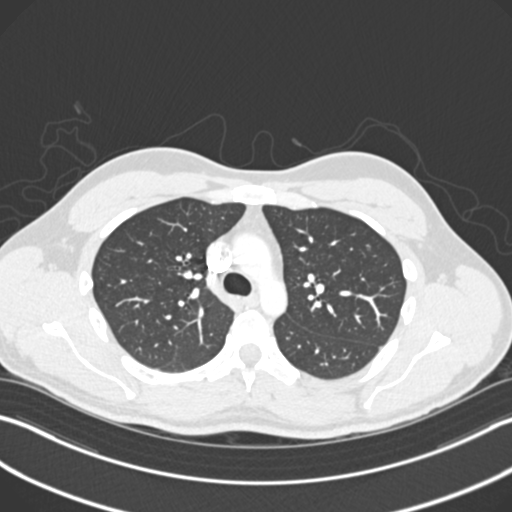
[im 197/312  mediastinal]
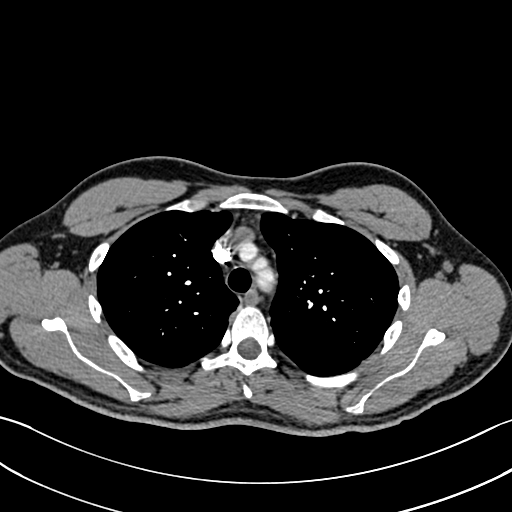
[im 213/312  lung]
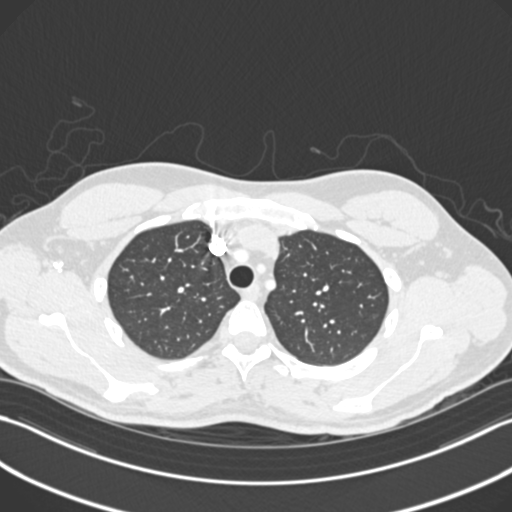
[im 230/312  mediastinal]
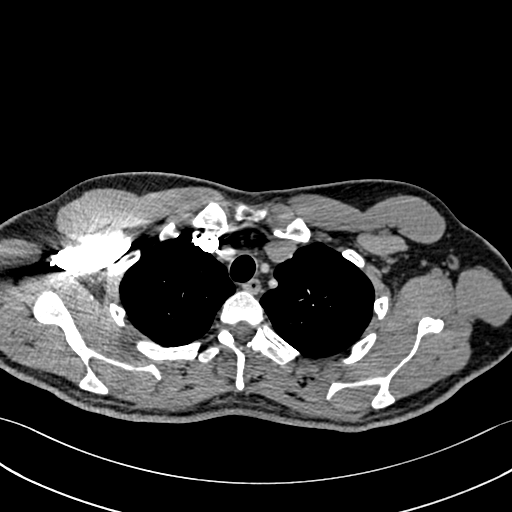
[im 246/312  lung]
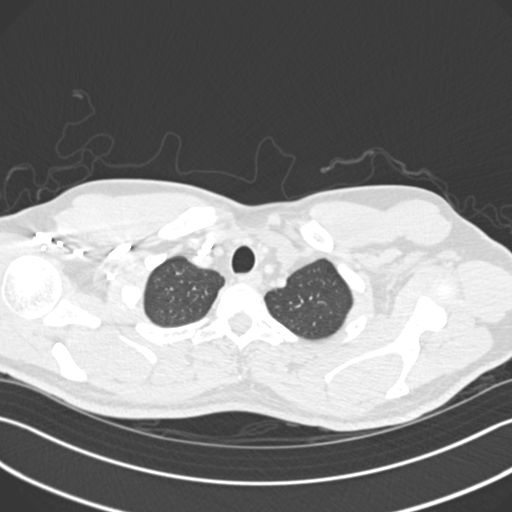
[im 262/312  mediastinal]
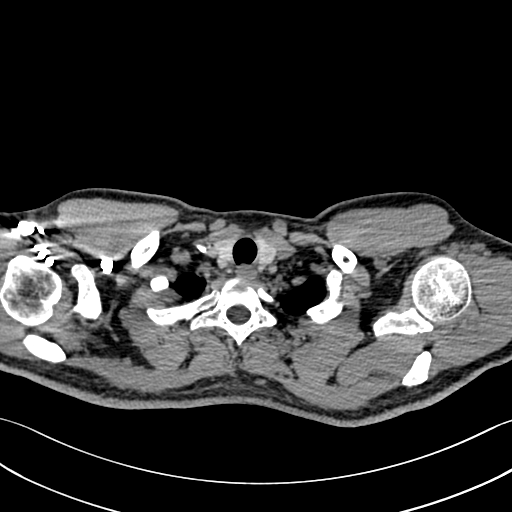
[im 279/312  lung]
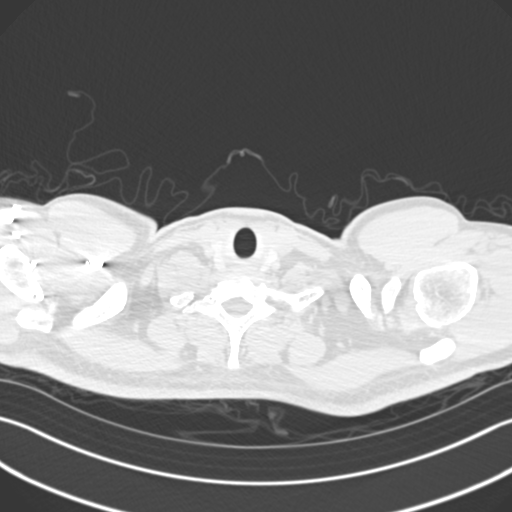
[im 295/312  mediastinal]
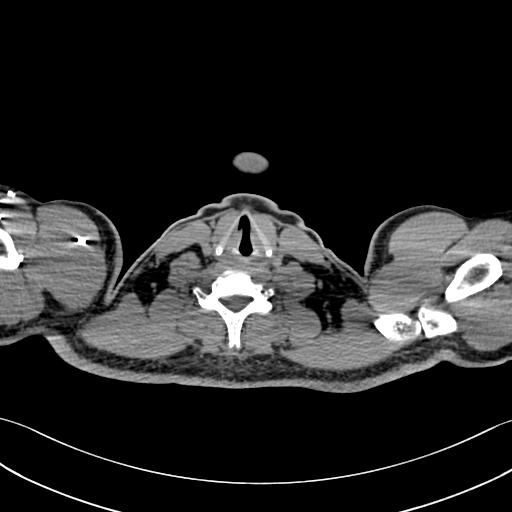

[19 of 36 positions shown; findings below may reference images not displayed]

FINDINGS: Cardiovascular: Satisfactory opacification of the pulmonary arteries
to the segmental level. No evidence of pulmonary embolism. Normal
heart size. No pericardial effusion.

Mediastinum/Nodes: No enlarged mediastinal, hilar, or axillary lymph
nodes. Thyroid gland, trachea, and esophagus demonstrate no
significant findings.

Lungs/Pleura: Lungs are clear. No pleural effusion or pneumothorax.

Upper Abdomen: No acute abnormality.

Musculoskeletal: No chest wall abnormality. No acute or significant
osseous findings.

Review of the MIP images confirms the above findings.
IMPRESSION: No definite evidence of pulmonary embolus. No acute cardiopulmonary
abnormality seen.

## 2022-05-22 ENCOUNTER — Emergency Department (HOSPITAL_BASED_OUTPATIENT_CLINIC_OR_DEPARTMENT_OTHER)
Admission: EM | Admit: 2022-05-22 | Discharge: 2022-05-23 | Disposition: A | Discharge status: Home / Self Care | Payer: 59 | Attending: Emergency Medicine | Admitting: Emergency Medicine

## 2022-05-22 ENCOUNTER — Encounter (HOSPITAL_BASED_OUTPATIENT_CLINIC_OR_DEPARTMENT_OTHER): Payer: Self-pay

## 2022-05-22 ENCOUNTER — Other Ambulatory Visit: Payer: Self-pay

## 2022-05-22 DIAGNOSIS — M79661 Pain in right lower leg: Secondary | ICD-10-CM | POA: Insufficient documentation

## 2022-05-22 DIAGNOSIS — M79604 Pain in right leg: Secondary | ICD-10-CM

## 2022-05-22 DIAGNOSIS — Z7901 Long term (current) use of anticoagulants: Secondary | ICD-10-CM | POA: Diagnosis not present

## 2022-05-22 DIAGNOSIS — Z7982 Long term (current) use of aspirin: Secondary | ICD-10-CM | POA: Insufficient documentation

## 2022-05-22 DIAGNOSIS — J45909 Unspecified asthma, uncomplicated: Secondary | ICD-10-CM | POA: Insufficient documentation

## 2022-05-22 NOTE — Discharge Instructions (Signed)
Return for your ultrasound.  Continue your xarelto as prescribed.

## 2022-05-22 NOTE — ED Triage Notes (Addendum)
Patient here POV from Home.  Endorses Right Leg Pain that Began Yesterday AM. Pain is Posterior Leg.   History of Pro-Thrombin Gene Mutation and takes Xarelto for Clotting Prevention. History of DVT in LLE.   No Fevers. No Injury. Pulse Palpable Distally.  NAD Noted during Triage. A&Ox4. Gcs 15. Ambulatory.

## 2022-05-23 ENCOUNTER — Ambulatory Visit (HOSPITAL_BASED_OUTPATIENT_CLINIC_OR_DEPARTMENT_OTHER)
Admission: RE | Admit: 2022-05-23 | Discharge: 2022-05-23 | Disposition: A | Discharge status: Home / Self Care | Payer: 59 | Source: Ambulatory Visit | Attending: Emergency Medicine | Admitting: Emergency Medicine

## 2022-05-23 DIAGNOSIS — M79604 Pain in right leg: Secondary | ICD-10-CM | POA: Insufficient documentation

## 2022-05-23 NOTE — ED Provider Notes (Signed)
[facility] EMERGENCY DEPT Provider Note   CSN: [id number] Arrival date & time: 05/22/22  2218     History  Chief Complaint  Patient presents with   Leg Pain    Wesley Stark is a 31 y.o. male.  HPI     This is a 31 year old male who presents with right leg pain.  Patient reports that he feels that his leg feels "funny".  He has not had any leg swelling.  Has not noted any overlying skin changes.  He has a history of DVT in the left leg and takes Xarelto daily.  He did miss a dose on Thursday.  He has not had any chest pain or shortness of breath.  Denies injury.  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  Home Medications Prior to Admission medications   Medication Sig Start Date End Date Taking? Authorizing Provider  albuterol (PROVENTIL HFA;VENTOLIN HFA) 108 (90 BASE) MCG/ACT inhaler Inhale 2 puffs into the lungs every 6 (six) hours as needed for wheezing or shortness of breath. Patient not taking: Reported on 05/05/2015 09/10/13   Roderick Pee, MD  arginine 500 MG tablet Take 3 tablets by mouth daily    [provider]  aspirin 81 MG tablet Take 81 mg by mouth daily.    [provider]  azelastine (ASTELIN) 0.1 % nasal spray Place 1 spray into both nostrils 2 (two) times daily. Use in each nostril as directed    [provider]  finasteride (PROSCAR) 5 MG tablet TAKE 1 TABLET BY MOUTH DAILY. 03/13/15   Roderick Pee, MD  FLUoxetine (PROZAC) 20 MG capsule Take 20 mg by mouth at bedtime.    [provider]  fluticasone (FLONASE) 50 MCG/ACT nasal spray USE 1 SPRAY UP EACH NOSTRIL EACH BEDTIME 04/24/15   Roderick Pee, MD  Magnesium 400 MG CAPS Take 400 mg by mouth daily.    [provider]  methylphenidate 18 MG PO CR tablet Take 18 mg by mouth daily.    [provider]  propranolol (INDERAL) 20 MG tablet TAKE 1 TABLET EVERY DAY AS NEEDED 02/05/14   Roderick Pee, MD  Rivaroxaban  15 & 20 MG TBPK Follow package directions: Take one 15mg  tablet by mouth twice a day. On day 22, switch to one 20mg  tablet once a day. Always take with a large meal. 08/14/19   , PA-C      Allergies    Patient has no known allergies.    Review of Systems   Review of Systems  Musculoskeletal:        Right leg pain  All other systems reviewed and are negative.   Physical Exam Updated Vital Signs BP 121/68 (BP Location: Right Arm)   Pulse 62   Temp 97.6 F (36.4 C) (Oral)   Resp 18   Ht 1.778 m (5\' 10" )   Wt 74.8 kg   SpO2 100%   BMI 23.66 kg/m  Physical Exam Vitals and nursing note reviewed.  Constitutional:      Appearance: He is well-developed. He is not ill-appearing.  HENT:     Head: Normocephalic and atraumatic.  Eyes:     Pupils: Pupils are equal, round, and reactive to light.  Cardiovascular:     Rate and Rhythm: Normal rate and regular rhythm.  Pulmonary:     Effort: Pulmonary effort is normal. No respiratory distress.  Musculoskeletal:     Cervical back: Neck supple.  Comments: No tenderness to palpation to bilateral calves, no obvious swelling noted, no overlying skin changes  Lymphadenopathy:     Cervical: No cervical adenopathy.  Skin:    General: Skin is warm and dry.  Neurological:     Mental Status: He is alert and oriented to person, place, and time.  Psychiatric:        Mood and Affect: Mood normal.     ED Results / Procedures / Treatments   Labs (all labs ordered are listed, but only abnormal results are displayed) Labs Reviewed - No data to display  EKG None  Radiology No results found.  Procedures Procedures    Medications Ordered in ED Medications - No data to display  ED Course/ Medical Decision Making/ A&P                           Medical Decision Making  This patient presents to the ED for concern of leg pain, this involves an extensive number of treatment options, and is a complaint that carries with it  a high risk of complications and morbidity.  I considered the following differential and admission for this acute, potentially life threatening condition.  The differential diagnosis includes DVT, muscle spasm, less likely cellulitis  MDM:    This is a 31 year old male who presents with right leg pain.  History of DVT.  He is overall nontoxic and vital signs are reassuring.  Takes Xarelto.  Did miss 1 dose.  Clinically, his physical exam is fairly benign.  He denies chest pain or shortness of breath so I have low suspicion for PE.  He denies injury so doubt fracture or contusion.  I do feel he warrants DVT study given his history.  Do not have access to DVT studies at this hour.  We will schedule a DVT study.  Instructed him to take his Xarelto as scheduled.  (Labs, imaging, consults)  Labs: I Ordered, and personally interpreted labs.  The pertinent results include: None  Imaging Studies ordered: I ordered imaging studies including none I independently visualized and interpreted imaging. I agree with the radiologist interpretation  Additional history obtained from chart review.  External records from outside source obtained and reviewed including prior evaluations  Cardiac Monitoring: The patient was maintained on a cardiac monitor.  I personally viewed and interpreted the cardiac monitored which showed an underlying rhythm of: Sinus rhythm  Reevaluation: After the interventions noted above, I reevaluated the patient and found that they have :stayed the same  Social Determinants of Health: Lives independently  Disposition: Discharge  Co morbidities that complicate the patient evaluation  Past Medical History:  Diagnosis Date   Acne    Allergy    Anxiety    Asthma    DVT, lower extremity ([place]) 08/08/2012   06/06/12   Prothrombin gene mutation ([place])      Medicines No orders of the defined types were placed in this encounter.   I have reviewed the patients home medicines and  have made adjustments as needed  Problem List / ED Course: Problem List Items Addressed This Visit   None Visit Diagnoses     Pain of right lower extremity    -  Primary                   Final Clinical Impression(s) / ED Diagnoses Final diagnoses:  Pain of right lower extremity    Rx / DC Orders ED Discharge  Orders          Ordered    US Venous Img Lower Unilateral Right        05/22/22 2342              Shon Baton, MD 05/23/22 [phone]
